# Patient Record
Sex: Male | Born: 1980 | Race: White | Hispanic: No | Marital: Single | Smoking: Current every day smoker
Health system: Southern US, Community
[De-identification: ages and names within clinical notes are randomized; demographics above are authoritative.]

## PROBLEM LIST (undated history)

## (undated) HISTORY — PX: ANKLE SURGERY: SHX546

---

## 2009-11-10 ENCOUNTER — Ambulatory Visit: Payer: Self-pay | Admitting: Diagnostic Radiology

## 2009-11-10 ENCOUNTER — Emergency Department (HOSPITAL_BASED_OUTPATIENT_CLINIC_OR_DEPARTMENT_OTHER): Admission: EM | Admit: 2009-11-10 | Discharge: 2009-11-10 | Payer: Self-pay | Admitting: Emergency Medicine

## 2010-04-22 ENCOUNTER — Emergency Department (HOSPITAL_BASED_OUTPATIENT_CLINIC_OR_DEPARTMENT_OTHER): Admission: EM | Admit: 2010-04-22 | Discharge: 2010-04-22 | Payer: Self-pay | Admitting: Emergency Medicine

## 2010-07-20 ENCOUNTER — Emergency Department (INDEPENDENT_AMBULATORY_CARE_PROVIDER_SITE_OTHER)
Admission: EM | Admit: 2010-07-20 | Discharge: 2010-07-20 | Payer: Self-pay | Source: Home / Self Care | Admitting: Emergency Medicine

## 2010-07-20 DIAGNOSIS — S81009A Unspecified open wound, unspecified knee, initial encounter: Secondary | ICD-10-CM

## 2010-07-20 DIAGNOSIS — Z1810 Retained metal fragments, unspecified: Secondary | ICD-10-CM

## 2010-07-20 DIAGNOSIS — Y9269 Other specified industrial and construction area as the place of occurrence of the external cause: Secondary | ICD-10-CM

## 2010-07-20 DIAGNOSIS — W269XXA Contact with unspecified sharp object(s), initial encounter: Secondary | ICD-10-CM

## 2011-11-10 ENCOUNTER — Emergency Department (HOSPITAL_BASED_OUTPATIENT_CLINIC_OR_DEPARTMENT_OTHER): Payer: Self-pay

## 2011-11-10 ENCOUNTER — Encounter (HOSPITAL_BASED_OUTPATIENT_CLINIC_OR_DEPARTMENT_OTHER): Payer: Self-pay | Admitting: *Deleted

## 2011-11-10 ENCOUNTER — Emergency Department (HOSPITAL_BASED_OUTPATIENT_CLINIC_OR_DEPARTMENT_OTHER)
Admission: EM | Admit: 2011-11-10 | Discharge: 2011-11-10 | Disposition: A | Discharge status: Home / Self Care | Payer: Self-pay | Attending: Emergency Medicine | Admitting: Emergency Medicine

## 2011-11-10 DIAGNOSIS — Y990 Civilian activity done for income or pay: Secondary | ICD-10-CM | POA: Insufficient documentation

## 2011-11-10 DIAGNOSIS — W319XXA Contact with unspecified machinery, initial encounter: Secondary | ICD-10-CM | POA: Insufficient documentation

## 2011-11-10 DIAGNOSIS — S6010XA Contusion of unspecified finger with damage to nail, initial encounter: Secondary | ICD-10-CM

## 2011-11-10 DIAGNOSIS — S6000XA Contusion of unspecified finger without damage to nail, initial encounter: Secondary | ICD-10-CM | POA: Insufficient documentation

## 2011-11-10 DIAGNOSIS — F17200 Nicotine dependence, unspecified, uncomplicated: Secondary | ICD-10-CM | POA: Insufficient documentation

## 2011-11-10 NOTE — ED Provider Notes (Signed)
History     CSN: [id number]  Arrival date & time 11/10/11  1430   First MD Initiated Contact with Patient 11/10/11 1514      Chief Complaint  Patient presents with  . Finger Injury    (Consider location/radiation/quality/duration/timing/severity/associated sxs/prior treatment) Patient is a 31 y.o. male presenting with hand pain. The history is provided by the patient. No language interpreter was used.  Hand Pain This is a new problem. The current episode started yesterday. The problem occurs constantly. The problem has been gradually worsening. The symptoms are aggravated by nothing. He has tried nothing for the symptoms. The treatment provided moderate relief.  Pt reports finger was crushed in a machine.  Pt complains of blood under nail  History reviewed. No pertinent past medical history.  Past Surgical History  Procedure Date  . Ankle surgery     History reviewed. No pertinent family history.  History  Substance Use Topics  . Smoking status: Current Everyday Smoker  . Smokeless tobacco: Not on file  . Alcohol Use: Yes      Review of Systems  Skin: Positive for wound.  All other systems reviewed and are negative.    Allergies  Darvocet  Home Medications  No current outpatient prescriptions on file.  BP 143/80  Pulse 91  Temp(Src) 98.3 F (36.8 C) (Oral)  Resp 18  Ht 6\' 2"  (1.88 m)  Wt 260 lb (117.935 kg)  BMI 33.38 kg/m2  SpO2 96%  Physical Exam  Nursing note and vitals reviewed. Constitutional: He appears well-developed and well-nourished.  HENT:  Head: Normocephalic.  Musculoskeletal: He exhibits tenderness.       Subungual hematoma left index finger,   From,  nv and ns intact  Neurological: He is alert.  Skin: Skin is warm.  Psychiatric: He has a normal mood and affect.    ED Course  Procedures (including critical care time)  Labs Reviewed - No data to display No results found.   No diagnosis found.    MDM  No results found for  this or any previous visit. Dg Finger Index Left  11/10/2011  *RADIOLOGY REPORT*  Clinical Data: Injury to left index finger having pain in the distal aspect of the finger.  LEFT INDEX FINGER 2+V  Comparison: No priors  Findings: Three views of the left second finger demonstrate no acute fracture, subluxation, dislocation, joint or soft tissue abnormality.  IMPRESSION: No acute radiographic abnormality of the left second finger.  Original Report Authenticated By: Florencia Reasons, M.D.   Procedure.   Alcohol prep,  Eye cautery used to punture nail, trepanation  (Pt reports pain and pressure relief)        Elson Areas, [place] 11/10/11 1639

## 2011-11-10 NOTE — ED Notes (Signed)
Pt states he was helping friend put up fence and a 2000 lb machine came down on his left index finger. C/O pain and swelling to same. Bruising noted. Cannot feel touch at tip. Cap refill<3 sec

## 2011-11-10 NOTE — ED Provider Notes (Signed)
Medical screening examination/treatment/procedure(s) were performed by non-physician practitioner and as supervising physician I was immediately available for consultation/collaboration.   Kharisma Glasner, MD 11/10/11 2032 

## 2011-11-10 NOTE — Discharge Instructions (Signed)
Subungual Hematoma  A subungual hematoma is a collection of blood under the fingernail. It is caused by an injury to fingers or toes that breaks the blood vessels beneath the nail. The caregiver may have made a hole in the nail to drain the blood. Draining the blood from beneath the nail is painless and usually gives dramatic relief from the pain. Your nail will usually grow back normally.  HOME CARE INSTRUCTIONS    Apply ice to the injured area for 15 to 20 minutes, 3 to 4 times per day for the first 1 or 2 days.   Put the ice in a plastic bag and place a towel between the bag of ice and your skin. Discontinue use if it causes pain.   Elevate your hand or your foot whenever possible to decrease pain and swelling.   You may remove the bandage in the number of days directed by your caregiver.   Your nail may fall off. If this occurs, trim it gently to keep it from catching on something and causing further injury.   If you have been given a tetanus shot, your arm may get swollen, red, and warm to touch at the shot site. This is a normal response to the medicine in the shot. If you did not receive a tetanus shot today because you did not recall when your last one was given, check with your caregiver's office and determine if one is needed. Generally for a "dirty" wound, you should receive a tetanus booster if you have not had one in the last five years. If you have a "clean" wound, you should receive a tetanus booster if you have not had one within the last ten years.  SEEK IMMEDIATE MEDICAL CARE IF:    You develop redness (inflammation) or swelling around the affected nail.   You develop a pus like (purulent) discharge from around the affected nail.   You have pain not controlled with over-the-counter medicines. Only take over-the-counter or prescription medicines for pain, discomfort, or fever as directed by your caregiver.   You have a fever.  Document Released: 06/07/2000 Document Revised: 05/30/2011  Document Reviewed: 05/29/2011  [employer] Patient Information 2012 [employer].

## 2012-01-03 ENCOUNTER — Emergency Department (HOSPITAL_BASED_OUTPATIENT_CLINIC_OR_DEPARTMENT_OTHER)
Admission: EM | Admit: 2012-01-03 | Discharge: 2012-01-03 | Disposition: A | Discharge status: Home / Self Care | Payer: Self-pay | Attending: Emergency Medicine | Admitting: Emergency Medicine

## 2012-01-03 ENCOUNTER — Encounter (HOSPITAL_BASED_OUTPATIENT_CLINIC_OR_DEPARTMENT_OTHER): Payer: Self-pay | Admitting: Family Medicine

## 2012-01-03 DIAGNOSIS — K029 Dental caries, unspecified: Secondary | ICD-10-CM | POA: Insufficient documentation

## 2012-01-03 DIAGNOSIS — K0889 Other specified disorders of teeth and supporting structures: Secondary | ICD-10-CM

## 2012-01-03 MED ORDER — IBUPROFEN 800 MG PO TABS: 800.0000 mg | ORAL_TABLET | Freq: Three times a day (TID) | ORAL | Status: AC

## 2012-01-03 MED ORDER — PENICILLIN V POTASSIUM 500 MG PO TABS: 500.0000 mg | ORAL_TABLET | Freq: Three times a day (TID) | ORAL | Status: AC

## 2012-01-03 MED ORDER — HYDROCODONE-ACETAMINOPHEN 5-325 MG PO TABS: 2.0000 | ORAL_TABLET | ORAL | Status: AC | PRN

## 2012-01-03 NOTE — ED Provider Notes (Signed)
History     CSN: [id number]  Arrival date & time 01/03/12  8295   First MD Initiated Contact with Patient 01/03/12 1914      Chief Complaint  Patient presents with  . Dental Pain    (Consider location/radiation/quality/duration/timing/severity/associated sxs/prior treatment) Patient is a 31 y.o. male presenting with tooth pain. The history is provided by the patient.  Dental PainThe primary symptoms include mouth pain. Primary symptoms do not include fever. The symptoms are worsening.  Additional symptoms do not include: facial swelling. Associated symptoms comments: Pain at site of known cavity x 2 days. No facial swelling or fever. He has dental appointment next week for further care. Marland Kitchen    History reviewed. No pertinent past medical history.  Past Surgical History  Procedure Date  . Ankle surgery     No family history on file.  History  Substance Use Topics  . Smoking status: Current Everyday Smoker  . Smokeless tobacco: Not on file  . Alcohol Use: Yes      Review of Systems  Constitutional: Negative for fever.  HENT: Positive for dental problem. Negative for facial swelling.     Allergies  Darvocet  Home Medications   Current Outpatient Rx  Name Route Sig Dispense Refill  . TIGER BALM PAIN RELIEVING EX Apply externally Apply 1 application topically daily as needed. Patient used this medication for pain.    . IBUPROFEN 200 MG PO TABS Oral Take 400 mg by mouth every 6 (six) hours as needed. Patient used this medication for his ankle pain.    Marland Kitchen ONE-DAILY MULTI VITAMINS PO TABS Oral Take 1 tablet by mouth daily.      BP 130/67  Pulse 81  Temp 98 F (36.7 C) (Oral)  Resp 20  Ht 6\' 3"  (1.905 m)  Wt 270 lb (122.471 kg)  BMI 33.75 kg/m2  SpO2 97%  Physical Exam  Constitutional: He appears well-developed and well-nourished.  HENT:       Generally good dentition. Large eroded cavity in right lower 2nd molar. Surround gum tenderness without pointing  abscess.     ED Course  Procedures (including critical care time)  Labs Reviewed - No data to display No results found.   No diagnosis found. 1. Dental cavity    MDM  No further evaluation required. Will treat with abx, pain medication.        Rodena Medin, PA-C 01/03/12 2002

## 2012-01-03 NOTE — ED Notes (Signed)
Pt sts he has a chip on right lower back tooth x 1 year. Pt sts today it started hurting. Pt sts dentist couldn't see him today.

## 2012-01-04 NOTE — ED Provider Notes (Signed)
Medical screening examination/treatment/procedure(s) were performed by non-physician practitioner and as supervising physician I was immediately available for consultation/collaboration.   Leigh-Ann Jasa Dundon, MD 01/04/12 0129 

## 2012-01-20 IMAGING — CR DG TIBIA/FIBULA 2V*R*
4 series · 4 of 4 positions shown · non-contrast
Comparison: None.

CLINICAL DATA: Penetrating trauma.

RIGHT TIBIA AND FIBULA - 2 VIEW

[t tib/fib ap right (1 of 2)]
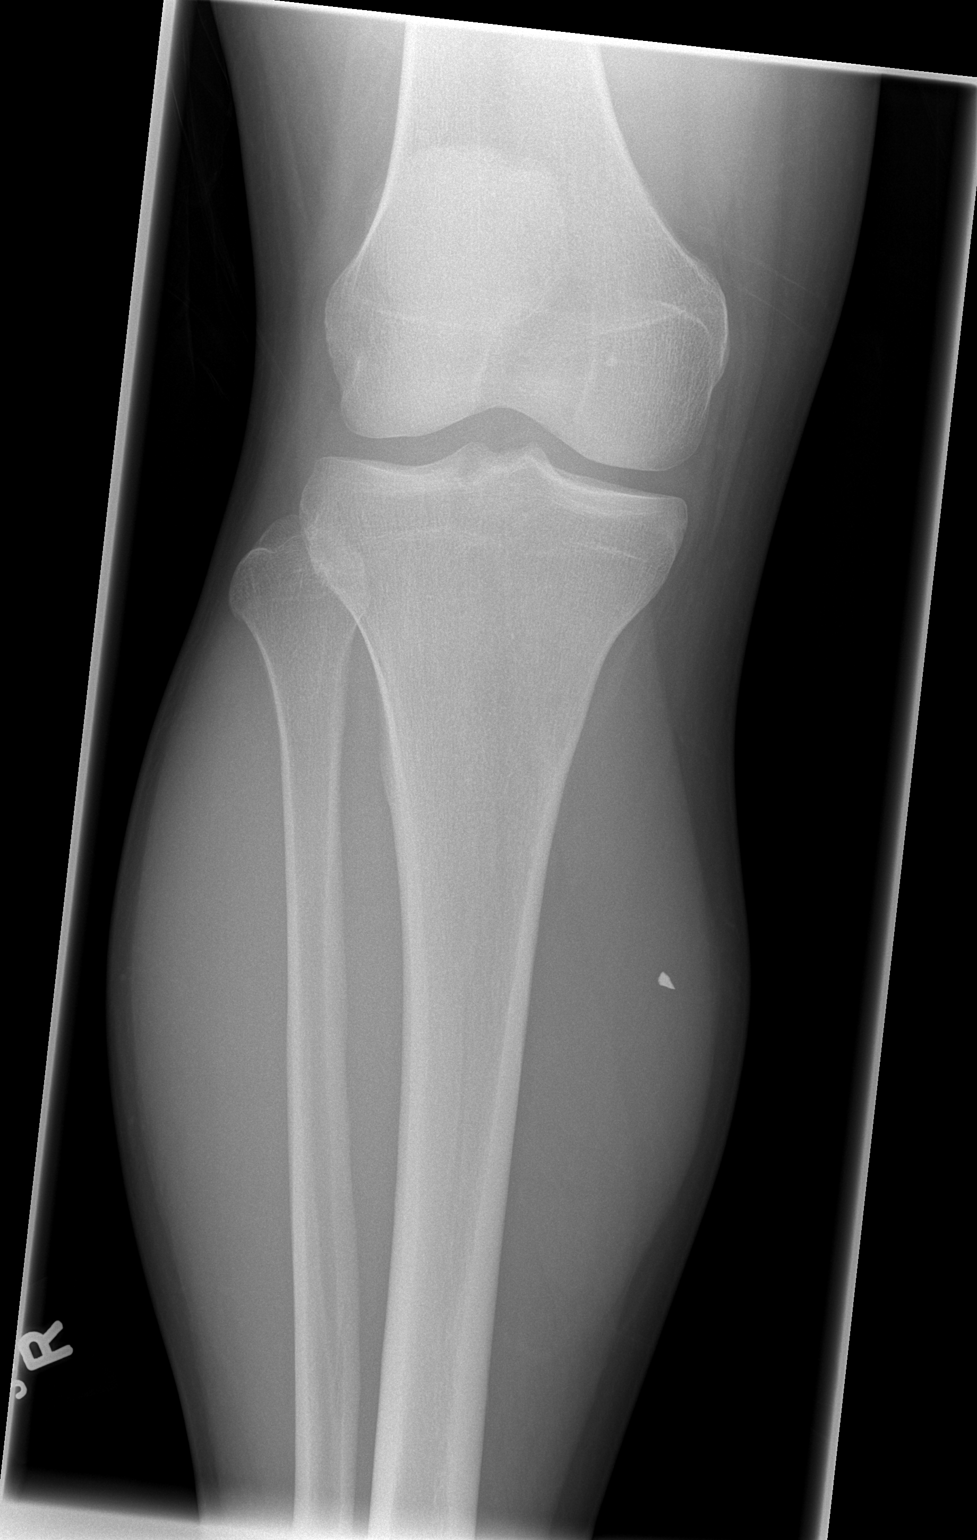

[t tib/fib ap right (2 of 2)]
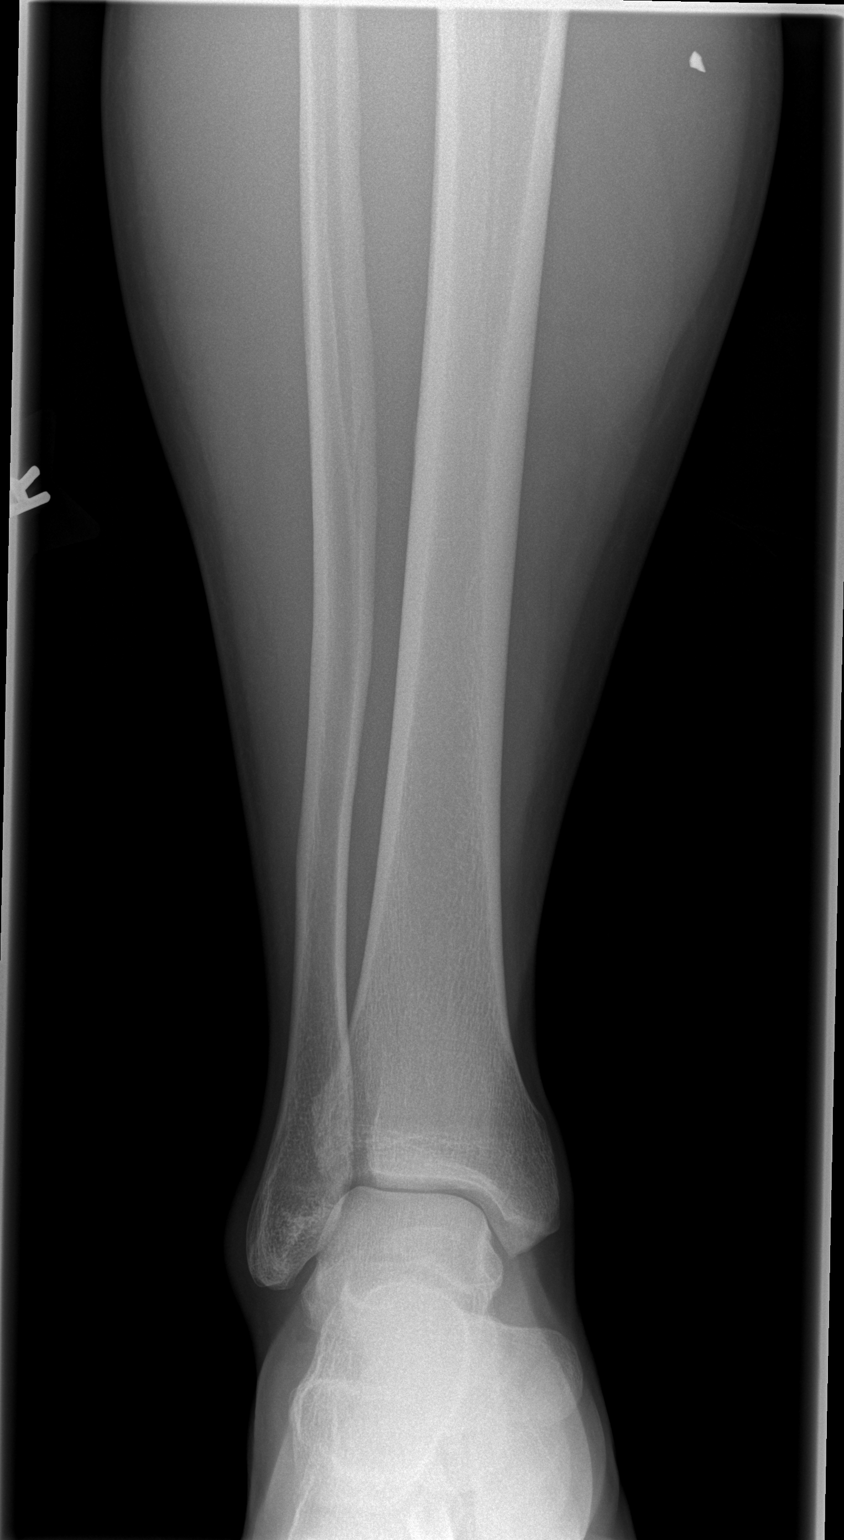

[t tib/fib lat right (1 of 2)]
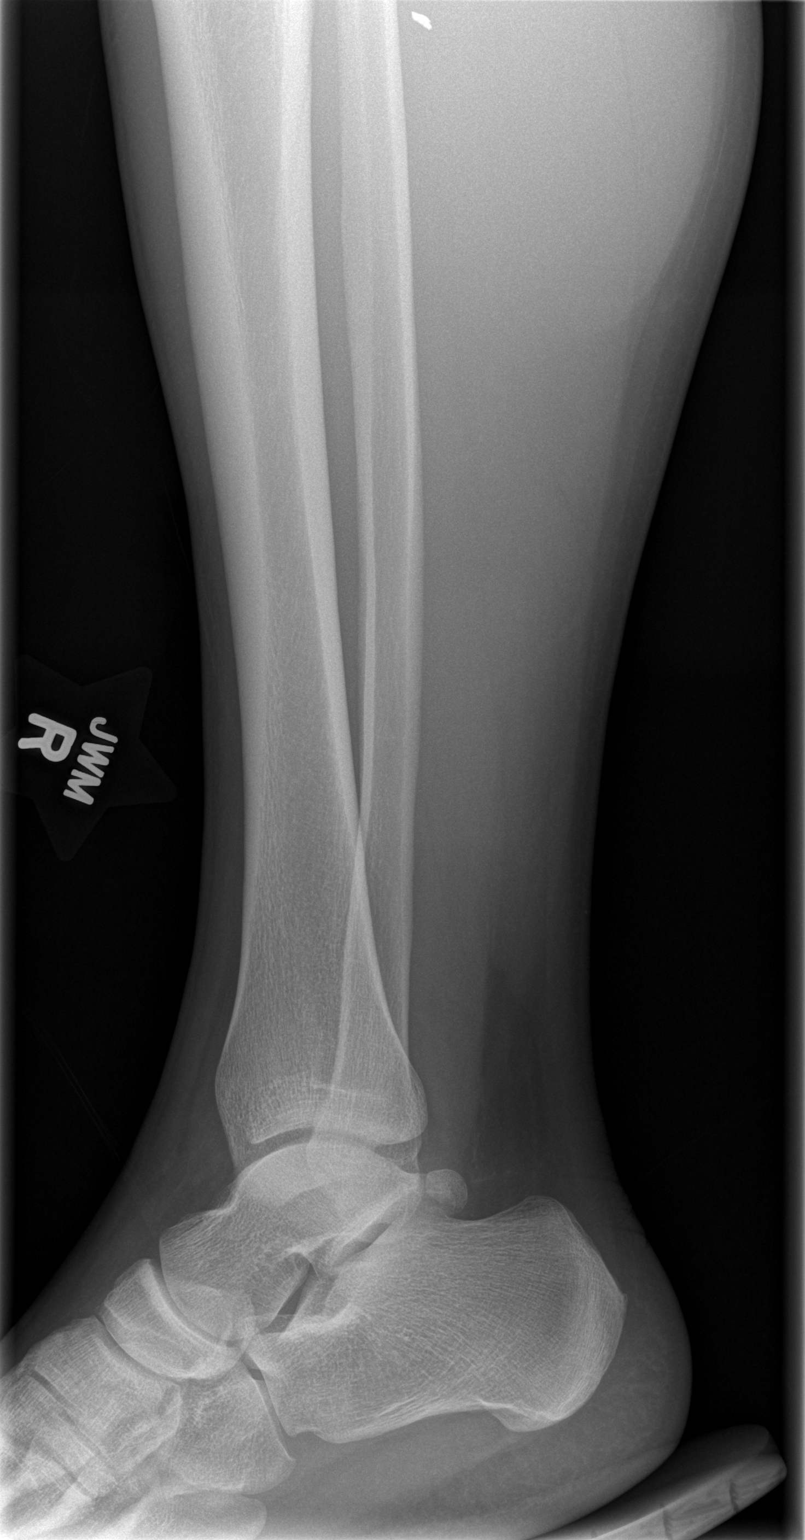

[t tib/fib lat right (2 of 2)]
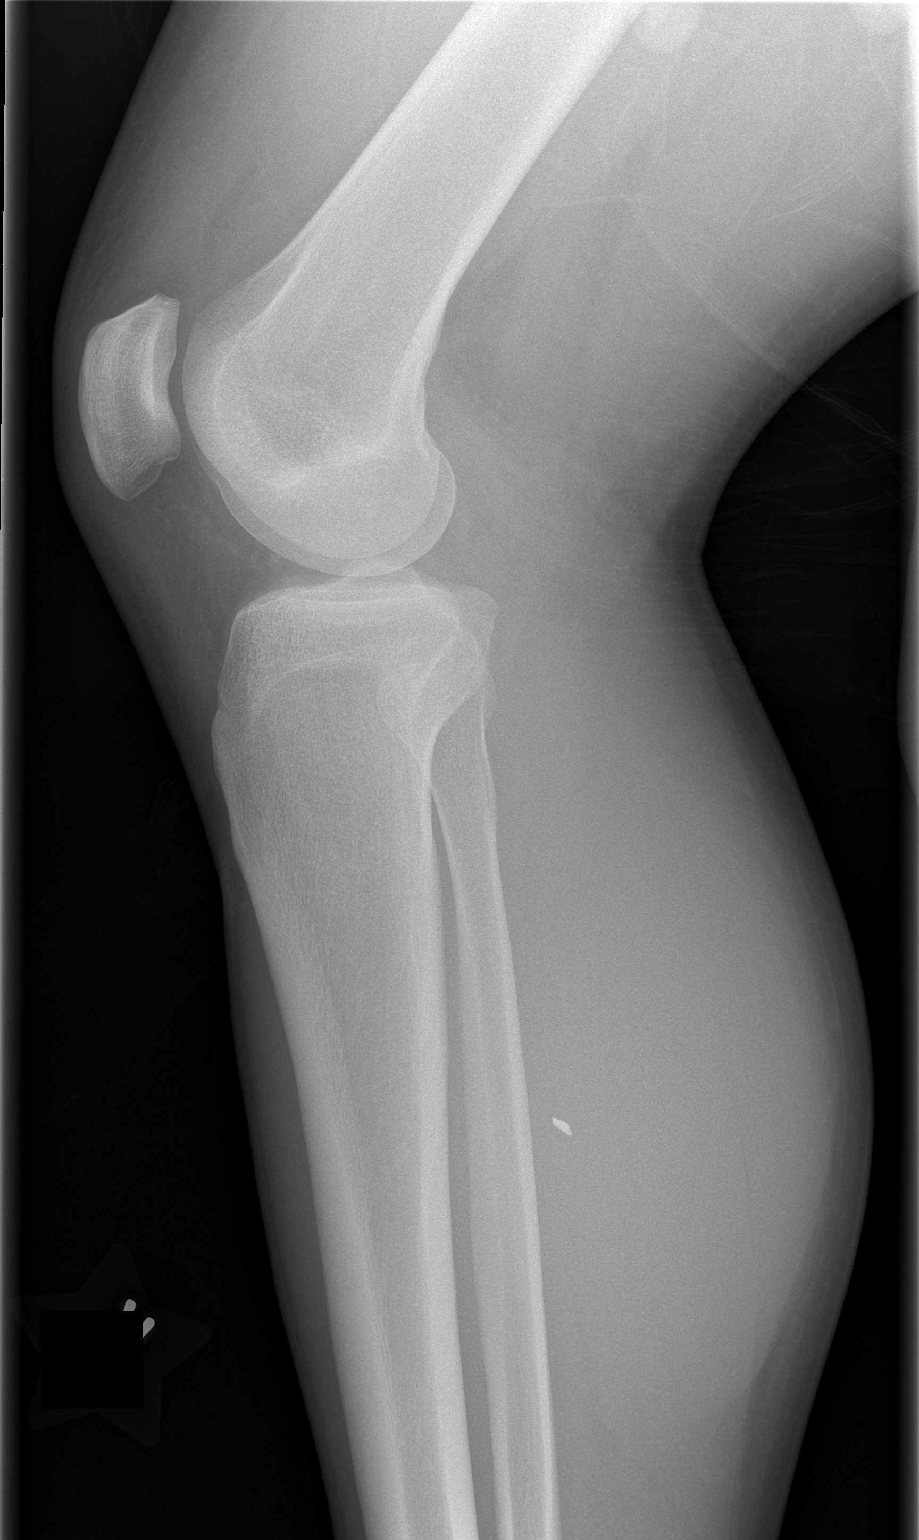

[4 of 4 positions shown; findings below may reference images not displayed]

FINDINGS: A metallic radiopaque foreign body measuring a maximum
0.5 cm in diameter is seen in the medial soft tissues of the right
lower leg.  The foreign body appears to lie within muscle.  No soft
tissue gas collection is identified.  No fracture.
IMPRESSION: Metallic foreign body in the musculature of the medial right lower
leg as above.

## 2012-09-19 ENCOUNTER — Emergency Department (HOSPITAL_BASED_OUTPATIENT_CLINIC_OR_DEPARTMENT_OTHER)
Admission: EM | Admit: 2012-09-19 | Discharge: 2012-09-19 | Disposition: A | Discharge status: Home / Self Care | Payer: Self-pay | Attending: Emergency Medicine | Admitting: Emergency Medicine

## 2012-09-19 ENCOUNTER — Emergency Department (HOSPITAL_BASED_OUTPATIENT_CLINIC_OR_DEPARTMENT_OTHER): Payer: Self-pay

## 2012-09-19 ENCOUNTER — Encounter (HOSPITAL_BASED_OUTPATIENT_CLINIC_OR_DEPARTMENT_OTHER): Payer: Self-pay | Admitting: *Deleted

## 2012-09-19 DIAGNOSIS — M549 Dorsalgia, unspecified: Secondary | ICD-10-CM | POA: Insufficient documentation

## 2012-09-19 DIAGNOSIS — M542 Cervicalgia: Secondary | ICD-10-CM | POA: Insufficient documentation

## 2012-09-19 DIAGNOSIS — F17200 Nicotine dependence, unspecified, uncomplicated: Secondary | ICD-10-CM | POA: Insufficient documentation

## 2012-09-19 DIAGNOSIS — R209 Unspecified disturbances of skin sensation: Secondary | ICD-10-CM | POA: Insufficient documentation

## 2012-09-19 DIAGNOSIS — Z79899 Other long term (current) drug therapy: Secondary | ICD-10-CM | POA: Insufficient documentation

## 2012-09-19 DIAGNOSIS — M25519 Pain in unspecified shoulder: Secondary | ICD-10-CM | POA: Insufficient documentation

## 2012-09-19 DIAGNOSIS — M25512 Pain in left shoulder: Secondary | ICD-10-CM

## 2012-09-19 MED ORDER — HYDROCODONE-ACETAMINOPHEN 5-325 MG PO TABS: 1.0000 | ORAL_TABLET | ORAL | Status: DC | PRN

## 2012-09-19 NOTE — ED Notes (Signed)
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 PMS intact. Some intermittent periods of tingling at times.

## 2012-09-19 NOTE — ED Provider Notes (Signed)
History     CSN: [id number]  Arrival date & time 09/19/12  1616   First MD Initiated Contact with Patient 09/19/12 1738      Chief Complaint  Patient presents with  . Shoulder Pain    (Consider location/radiation/quality/duration/timing/severity/associated sxs/prior treatment) HPI Comments: Patient is a 32 y/o M c/o left shoulder pain x 8 days. Patient reported that last Saturday, while working, patient was cutting trees and when he threw a log he hurt his left shoulder. Patient reported that pain is more of shooting pain that is severe and constant, with radiation to the left arm, left side of the neck, left scapula, left mid-axillary region. Patient reported that shoulder is mainly stiff in the morning and pain is more of a constant, dull ache in the evening. Patient reported that motion and lifting heavy objects increases the pain. Patient reported using icy hot, heating pads, and Ibuprofen with minimal relief. Associated symptoms are intermittent numbness to left arm and digits depending on position, mild swelling that has decreased. Patient denied paresthesias, direct trauma to left shoulder, inflammation, erythema, chest pain, shortness of breathe, difficulty breathing, headaches, dizziness, visual distortions.   Patient is a 32 y.o. male presenting with shoulder pain. The history is provided by the patient. No language interpreter was used.  Shoulder Pain This is a new problem. The current episode started in the past 7 days. The problem occurs constantly. The problem has been gradually worsening. Associated symptoms include arthralgias, neck pain and numbness. Pertinent negatives include no abdominal pain, change in bowel habit, chest pain, diaphoresis, fever, headaches, nausea, rash, sore throat, visual change, vomiting or weakness. Exacerbated by: Lifting heavy objects, motion of left shoulder. He has tried NSAIDs (icy hot, icing) for the symptoms. The treatment provided no relief.     History reviewed. No pertinent past medical history.  Past Surgical History  Procedure Laterality Date  . Ankle surgery      History reviewed. No pertinent family history.  History  Substance Use Topics  . Smoking status: Current Every Day Smoker  . Smokeless tobacco: Not on file  . Alcohol Use: Yes      Review of Systems  Constitutional: Negative for fever and diaphoresis.  HENT: Positive for neck pain. Negative for sore throat.   Eyes: Negative for visual disturbance.  Respiratory: Negative for choking, chest tightness and shortness of breath.   Cardiovascular: Negative for chest pain.  Gastrointestinal: Negative for nausea, vomiting, abdominal pain, diarrhea, constipation and change in bowel habit.  Musculoskeletal: Positive for back pain and arthralgias.       Left shoulder pain  Skin: Negative for rash.  Neurological: Positive for numbness. Negative for dizziness, weakness and headaches.  All other systems reviewed and are negative.    Allergies  Darvocet  Home Medications   Current Outpatient Rx  Name  Route  Sig  Dispense  Refill  . Camphor-Menthol-Capsicum (TIGER BALM PAIN RELIEVING EX)   Apply externally   Apply 1 application topically daily as needed. Patient used this medication for pain.         Marland Kitchen HYDROcodone-acetaminophen (NORCO) 5-325 MG per tablet   Oral   Take 1 tablet by mouth every 4 (four) hours as needed for pain.   6 tablet   0   . ibuprofen (ADVIL,MOTRIN) 200 MG tablet   Oral   Take 400 mg by mouth every 6 (six) hours as needed. Patient used this medication for his ankle pain.         Marland Kitchen  Multiple Vitamin (MULTIVITAMIN) tablet   Oral   Take 1 tablet by mouth daily.           BP 137/69  Pulse 88  Temp(Src) 98.8 F (37.1 C) (Oral)  Resp 18  Ht 6\' 3"  (1.905 m)  Wt 270 lb (122.471 kg)  BMI 33.75 kg/m2  SpO2 100%  Physical Exam  Nursing note and vitals reviewed. Constitutional: He is oriented to person, place, and  time. He appears well-developed and well-nourished. No distress.  HENT:  Head: Normocephalic and atraumatic.  Eyes: Conjunctivae and EOM are normal. Pupils are equal, round, and reactive to light. Right eye exhibits no discharge. Left eye exhibits no discharge.  Neck: Normal range of motion. Neck supple. No tracheal deviation present.  Cardiovascular: Normal rate, regular rhythm and intact distal pulses.  Exam reveals no friction rub.   No murmur heard. Pulmonary/Chest: Breath sounds normal. No respiratory distress. He has no wheezes. He has no rales.  Musculoskeletal: Normal range of motion. He exhibits tenderness. He exhibits no edema.  Shoulders: No erythema, inflammation, swelling, deformities b/l. Limited left shoulder full ROM due to pain. Patient able to invert, evert, flex, and extend shoulder b/l. Positive Yergason sign. Negative drop arm test. 5+/5+ strength b/l. Sensation intact b/l. Pulses palpable b/l.   Full ROM to elbow, wrist, hand, and digits b/l.   Lymphadenopathy:    He has no cervical adenopathy.  Neurological: He is alert and oriented to person, place, and time. A cranial nerve deficit is present. He exhibits normal muscle tone. Coordination normal.  Skin: Skin is warm and dry. No rash noted. He is not diaphoretic. No erythema.  Psychiatric: He has a normal mood and affect. His behavior is normal. Thought content normal.    ED Course  Procedures (including critical care time)  Labs Reviewed - No data to display Dg Shoulder Left  09/19/2012  *RADIOLOGY REPORT*  Clinical Data: Left shoulder pain.  LEFT SHOULDER - 2+ VIEW  Comparison: None.  Findings: Three views of the left shoulder are negative for acute fracture or dislocation.  Visualized left ribs are intact.  No evidence for a pneumothorax.  The left scapula is intact.  IMPRESSION: No acute bony abnormalities in the left shoulder.   Original Report Authenticated By: Richarda Overlie, M.D.    Filed Vitals:   09/19/12 1628   BP: 137/69  Pulse: 88  Temp: 98.8 F (37.1 C)  Resp: 18      1. Shoulder pain, left       MDM  DDx: Musculoskeletal injury Left shoulder fracture  Left shoulder x-ray performed - no fracture or bony abnormality found. Suspected musculoskeletal injury to left shoulder. No neurovascular injury suspected. Patient afebrile, normotensive, non-tachycardic, alert and happy affect. Discharged patient with pain medications - discussed with patient on how to take medications - instructed not to drive, use heavy machinery, or drink alcohol or use other drugs while on pain medications. Discussed with patient to continue icing shoulder, rest shoulder, apply icy hot ointment and massage shoulder. Discussed with patient to follow-up with orthopedics on Monday (09/20/2012) - most likely going to need a MRI. Gave patient note for work. Patient agreed to plan of care, understood, all questions answered.          Raymon Mutton, PA-C 09/20/12 [phone]

## 2012-09-20 NOTE — ED Provider Notes (Signed)
Medical screening examination/treatment/procedure(s) were performed by non-physician practitioner and as supervising physician I was immediately available for consultation/collaboration.   Kylee Umana III, MD 09/20/12 1345 

## 2013-05-15 ENCOUNTER — Encounter (HOSPITAL_BASED_OUTPATIENT_CLINIC_OR_DEPARTMENT_OTHER): Payer: Self-pay | Admitting: Emergency Medicine

## 2013-05-15 ENCOUNTER — Emergency Department (HOSPITAL_BASED_OUTPATIENT_CLINIC_OR_DEPARTMENT_OTHER)
Admission: EM | Admit: 2013-05-15 | Discharge: 2013-05-15 | Disposition: A | Discharge status: Home / Self Care | Payer: Self-pay | Attending: Emergency Medicine | Admitting: Emergency Medicine

## 2013-05-15 DIAGNOSIS — F17200 Nicotine dependence, unspecified, uncomplicated: Secondary | ICD-10-CM | POA: Insufficient documentation

## 2013-05-15 DIAGNOSIS — K0381 Cracked tooth: Secondary | ICD-10-CM | POA: Insufficient documentation

## 2013-05-15 DIAGNOSIS — K029 Dental caries, unspecified: Secondary | ICD-10-CM | POA: Insufficient documentation

## 2013-05-15 DIAGNOSIS — R509 Fever, unspecified: Secondary | ICD-10-CM | POA: Insufficient documentation

## 2013-05-15 MED ORDER — PENICILLIN V POTASSIUM 500 MG PO TABS: 500.0000 mg | ORAL_TABLET | Freq: Four times a day (QID) | ORAL | Status: AC

## 2013-05-15 MED ORDER — HYDROCODONE-ACETAMINOPHEN 5-325 MG PO TABS: 2.0000 | ORAL_TABLET | ORAL | Status: DC | PRN

## 2013-05-15 NOTE — ED Notes (Signed)
Dental pain on right upper side.  Pt states the tooth was cracked and feels like it is starting to swell. Low grade fever.  No drainage noted.

## 2013-05-15 NOTE — ED Provider Notes (Signed)
CSN: [id number]     Arrival date & time 05/15/13  1229 History   First MD Initiated Contact with Patient 05/15/13 1244     Chief Complaint  Patient presents with  . Dental Pain   (Consider location/radiation/quality/duration/timing/severity/associated sxs/prior Treatment) HPI Comments: Patient presents with a two-day history of tooth pain. He says he has a cracked tooth it's been chronic for a long time but it started bothering him yesterday. He's noted some swelling to the area. He thought he had a fever last night. He denies any vomiting or facial swelling. He has appointment with his dentist to do some work on his teeth on Wednesday or Thursday of this week. He's usually over-the-counter medicines without relief.  Patient is a 32 y.o. male presenting with tooth pain.  Dental Pain Associated symptoms: fever   Associated symptoms: no facial swelling and no headaches     No past medical history on file. Past Surgical History  Procedure Laterality Date  . Ankle surgery     No family history on file. History  Substance Use Topics  . Smoking status: Current Every Day Smoker  . Smokeless tobacco: Not on file  . Alcohol Use: Yes    Review of Systems  Constitutional: Positive for fever.  HENT: Positive for dental problem. Negative for facial swelling.   Gastrointestinal: Negative for nausea and vomiting.  Neurological: Negative for headaches.    Allergies  Darvocet  Home Medications   Current Outpatient Rx  Name  Route  Sig  Dispense  Refill  . HYDROcodone-acetaminophen (NORCO/VICODIN) 5-325 MG per tablet   Oral   Take 2 tablets by mouth every 4 (four) hours as needed.   15 tablet   0   . ibuprofen (ADVIL,MOTRIN) 200 MG tablet   Oral   Take 400 mg by mouth every 6 (six) hours as needed. Patient used this medication for his ankle pain.         . Multiple Vitamin (MULTIVITAMIN) tablet   Oral   Take 1 tablet by mouth daily.         . penicillin v potassium  (VEETID) 500 MG tablet   Oral   Take 1 tablet (500 mg total) by mouth 4 (four) times daily.   40 tablet   0    BP 119/67  Pulse 75  Temp(Src) 98.3 F (36.8 C) (Oral)  Resp 16  Ht 6\' 2"  (1.88 m)  Wt 245 lb (111.131 kg)  BMI 31.44 kg/m2  SpO2 100% Physical Exam  Constitutional: He is oriented to person, place, and time. He appears well-developed and well-nourished.  HENT:  Patient is decayed teeth. He has tenderness along the right upper first molar. There is no induration or fluctuance. No trismus or facial swelling.  Cardiovascular: Normal rate.   Pulmonary/Chest: Effort normal.  Neurological: He is alert and oriented to person, place, and time.  Skin: Skin is warm and dry.    ED Course  Procedures (including critical care time) Labs Review Labs Reviewed - No data to display Imaging Review No results found.  EKG Interpretation   None       MDM   1. Dental caries    Will start patient on penicillin and Vicodin. Advised him to followup with his dentist this week or return as needed for any worsening symptoms.    Rolan Bucco, MD 05/15/13 1251

## 2013-08-12 ENCOUNTER — Encounter (HOSPITAL_BASED_OUTPATIENT_CLINIC_OR_DEPARTMENT_OTHER): Payer: Self-pay | Admitting: Emergency Medicine

## 2013-08-12 ENCOUNTER — Emergency Department (HOSPITAL_BASED_OUTPATIENT_CLINIC_OR_DEPARTMENT_OTHER)
Admission: EM | Admit: 2013-08-12 | Discharge: 2013-08-12 | Disposition: A | Discharge status: Home / Self Care | Payer: Self-pay | Attending: Emergency Medicine | Admitting: Emergency Medicine

## 2013-08-12 DIAGNOSIS — K029 Dental caries, unspecified: Secondary | ICD-10-CM | POA: Insufficient documentation

## 2013-08-12 DIAGNOSIS — K0889 Other specified disorders of teeth and supporting structures: Secondary | ICD-10-CM

## 2013-08-12 DIAGNOSIS — F17200 Nicotine dependence, unspecified, uncomplicated: Secondary | ICD-10-CM | POA: Insufficient documentation

## 2013-08-12 DIAGNOSIS — Z79899 Other long term (current) drug therapy: Secondary | ICD-10-CM | POA: Insufficient documentation

## 2013-08-12 DIAGNOSIS — Z791 Long term (current) use of non-steroidal anti-inflammatories (NSAID): Secondary | ICD-10-CM | POA: Insufficient documentation

## 2013-08-12 MED ORDER — NAPROXEN 500 MG PO TABS: 500.0000 mg | ORAL_TABLET | Freq: Two times a day (BID) | ORAL | Status: DC

## 2013-08-12 MED ORDER — PENICILLIN V POTASSIUM 500 MG PO TABS: 500.0000 mg | ORAL_TABLET | Freq: Four times a day (QID) | ORAL | Status: AC

## 2013-08-12 NOTE — ED Notes (Signed)
C/o cracked upper right tooth 1 weeks ago-now feels is abscessed with swelling

## 2013-08-12 NOTE — ED Notes (Signed)
Pt states that he broke his tooth Thanksgiving and has been having problems with the right upper tooth since. Pt states that he is hoping for antibiotics since the swelling has increased into his right check.

## 2013-08-12 NOTE — Discharge Instructions (Signed)
Dental Pain °A tooth ache may be caused by cavities (tooth decay). Cavities expose the nerve of the tooth to air and hot or cold temperatures. It may come from an infection or abscess (also called a boil or furuncle) around your tooth. It is also often caused by dental caries (tooth decay). This causes the pain you are having. °DIAGNOSIS  °Your caregiver can diagnose this problem by exam. °TREATMENT  °· If caused by an infection, it may be treated with medications which kill germs (antibiotics) and pain medications as prescribed by your caregiver. Take medications as directed. °· Only take over-the-counter or prescription medicines for pain, discomfort, or fever as directed by your caregiver. °· Whether the tooth ache today is caused by infection or dental disease, you should see your dentist as soon as possible for further care. °SEEK MEDICAL CARE IF: °The exam and treatment you received today has been provided on an emergency basis only. This is not a substitute for complete medical or dental care. If your problem worsens or new problems (symptoms) appear, and you are unable to meet with your dentist, call or return to this location. °SEEK IMMEDIATE MEDICAL CARE IF:  °· You have a fever. °· You develop redness and swelling of your face, jaw, or neck. °· You are unable to open your mouth. °· You have severe pain uncontrolled by pain medicine. °MAKE SURE YOU:  °· Understand these instructions. °· Will watch your condition. °· Will get help right away if you are not doing well or get worse. °Document Released: 06/10/2005 Document Revised: 09/02/2011 Document Reviewed: 01/27/2008 °ExitCare® Patient Information ©2014 [employer]. °  Emergency Department Resource Guide °1) Find a Doctor and Pay Out of Pocket °Although you won't have to find out who is covered by your insurance plan, it is a good idea to ask around and get recommendations. You will then need to call the office and see if the doctor you have chosen will  accept you as a new patient and what types of options they offer for patients who are self-pay. Some doctors offer discounts or will set up payment plans for their patients who do not have insurance, but you will need to ask so you aren't surprised when you get to your appointment. ° °2) Contact Your Local Health Department °Not all health departments have doctors that can see patients for sick visits, but many do, so it is worth a call to see if yours does. If you don't know where your local health department is, you can check in your phone book. The CDC also has a tool to help you locate your state's health department, and many state websites also have listings of all of their local health departments. ° °3) Find a Walk-in Clinic °If your illness is not likely to be very severe or complicated, you may want to try a walk in clinic. These are popping up all over the country in pharmacies, drugstores, and shopping centers. They're usually staffed by nurse practitioners or physician assistants that have been trained to treat common illnesses and complaints. They're usually fairly quick and inexpensive. However, if you have serious medical issues or chronic medical problems, these are probably not your best option. ° °No Primary Care Doctor: °- Call [facility] at  832-8000 - they can help you locate a primary care doctor that  accepts your insurance, provides certain services, etc. °- Physician Referral Service- 1-800-533-3463 ° °Chronic Pain Problems: °Organization           Address  Phone   Notes  °[facility]  (336) 297-2271 Patients need to be referred by their primary care doctor.  ° °Medication Assistance: °Organization         Address  Phone   Notes  °[place] [facility] [address] °[place], [place] [address] (336) 641-8030 --Must be a resident of [place] °-- Must have NO insurance coverage whatsoever (no Medicaid/ Medicare, etc.) °-- The pt.  MUST have a primary care doctor that directs their care regularly and follows them in the community °  °[facility]  (866) 331-1348   °[employer]  (888) 892-1162   ° °Agencies that provide inexpensive medical care: °Organization         Address  Phone   Notes  °[facility]  (336) 832-8035   °[facility]    (336) 832-7272   °[facility] [address] °[place], [place] [address] (336) 832-4777   °[facility] [address], °[place] (336) 271-4999   °[facility]    (336) 373-0678   °[facility]    (336) 272-1050   °[facility] ° [address], [place] Phone:  (336) 832-4444, Fax:  (336) 832-4440 Hours of Operation:  9 am - 6 pm, M-F.  Also accepts Medicaid/Medicare and self-pay.  °[facility] ° [address], [place] Phone: (336) 832-3150, Fax: (336) 832-3151. Hours of Operation:  8:30 am - 5:30 pm, M-F.  Also accepts Medicaid and self-pay.  °[facility] [place] [address], [place] Phone: (336) 878-6027   °[facility] [address], [place], [place] (336)723-1848, Ext. 123 Mondays & Thursdays: 7-9 AM.  First 15 patients are seen on a first come, first serve basis. °  ° °Medicaid-accepting [place] Providers: ° °Organization         Address  Phone   Notes  °[facility] [address], [place] (336) 641-2100 Also accepts self-pay patients.  °[facility] [address], [place] ° (336) 856-9996   °[facility] [address], [place] (336) 288-8857   °[facility] [address] [place] [address], [place] (336) 299-7000   °Veita Bland [address], [place]  ° (336) 373-1557 Only accepts [place] Access Medicaid patients after they have their name applied to their card.  ° °Self-Pay (no insurance) in  [place]: ° °Organization         Address  Phone   Notes  °Sickle Cell Patients, [facility] [address], [place] (336) 832-1970   °[facility] [address], [place] (336) 832-4400   ° [facility] ° [address], [place] (336) 992-4800   °[facility]/Dr. Osei-Bonsu ° [address] [place] [address], [place] or [address], [place] (336) 841-8500 Phone number for both [place] and [place] locations is the same.  °[facility] [address], [place] (336) 299-0000   °[facility] [address] [place] [address],  or [address] (336) 852-7530 °(336) 878-2260   °[facility] [address],  (336) 350-1642, phone; (336) 294-5005, fax Sees patients 1st and 3rd Saturday of every month.  Must not qualify   for public or private insurance (i.e. Medicaid, Medicare, [place] Health Choice, Veterans' Benefits) • Household income should be no more than 200% of the poverty level •The clinic cannot treat you if you are pregnant or think you are pregnant • Sexually transmitted diseases are not treated at the clinic.  ° °Dental Care: °Organization         Address  Phone  Notes  °[facility] [address], [place] (336) 641-6152 Accepts children up to age 21 who are enrolled in Medicaid or [place] Health Choice; pregnant women with a Medicaid card; and children who have applied for Medicaid or [place] Health Choice, but were declined, whose parents can pay a reduced fee at time of service.  °[facility] [place]  [address], [place] (336) 641-7733 Accepts children up to age 21 who are enrolled in Medicaid or [place] Health Choice; pregnant women with a Medicaid card; and children who have applied for Medicaid or [place] Health Choice, but were declined, whose parents  can pay a reduced fee at time of service.  °[facility] ° [address], [place] (336) 641-4533 Patients are seen by appointment only. Walk-ins are not accepted. [facility] will see patients 18 years of age and older. °Monday - Tuesday (8am-5pm) °Most Wednesdays (8:30-5pm) °$30 per visit, cash only  °[facility] ° [address], [place] (336) 641-4533 Patients are seen by appointment only. Walk-ins are not accepted. [facility] will see patients 18 years of age and older. °One Wednesday Evening (Monthly: Volunteer Based).  $30 per visit, cash only  °[facility]  (919) 537-3737 for adults; Children under age 4, call Graduate Pediatric Dentistry at (919) [phone]. Children aged 4-14, please call (919) [phone] to request a pediatric application. ° Dental services are provided in all areas of dental care including fillings, crowns and bridges, complete and partial dentures, implants, gum treatment, root canals, and extractions. Preventive care is also provided. Treatment is provided to both adults and children. °Patients are selected via a lottery and there is often a waiting list. °  °[facility] [address], °[place] ° (336) 763-8833 [email or web address] °  °[facility] [address], [place], [place] (336)723-1848, Ext. 123 Second and Fourth Thursday of each month, opens at 6:30 AM; Clinic ends at 9 AM.  Patients are seen on a first-come first-served basis, and a limited number are seen during each clinic.  ° °[facility] ° [address], [place], [place] (336) 723-7904   Eligibility Requirements °You must have lived in [place], [place], or [place] counties for at least the last three months. °  You cannot be eligible for state or federal sponsored healthcare insurance, including Veterans Administration, Medicaid, or Medicare. °  You generally cannot be eligible for healthcare  insurance through your employer.  °  How to apply: °Eligibility screenings are held every Tuesday and Wednesday afternoon from 1:00 pm until 4:00 pm. You do not need an appointment for the interview!  °[facility] [address], [place], [place] 336-631-2330   °[facility]  336-342-8273   °[facility]  336-703-3100   °[facility]  336-570-6415   ° °Behavioral Health Resources in the Community: °Intensive Outpatient Programs °Organization         Address  Phone    Notes  °[facility] [address], [place], [place] 336-878-6098   °[facility] [address], [place], [place] 336-832-9800   °[facility] [address], [place], [place] ° 336-882-2125   °[facility] [address],  °[place], [place] 1-800-853-5163 or 336-641-4981   °Substance Abuse Resources °Organization         Address  Phone  Notes  °[facility]  336-882-2125   °[facility]  336-784-9470   °[facility]  336-285-9073   °[facility]  336-845-3988   °Residential & Outpatient Substance Abuse Program  1-800-659-3381   °Psychological Services °Organization         Address  Phone  Notes  °[facility]  336- 832-9600   °[facility]  336- 378-7881   °[facility] [address], [place] 1-800-853-5163 or 336-641-4981   ° °Mobile Crisis Teams °Organization         Address  Phone  Notes  °[facility], Mobile Crisis Care Unit  1-877-626-1772   °Assertive °[facility] ° [address] [place], [place] 336-834-9664   °Sharon DeEsch [address] °[place] 336-554-5454   ° °Self-Help/Support Groups °Organization         Address  Phone             Notes  °[facility] - variety of support groups  336- 373-1402 Call for more information  °Narcotics Anonymous (NA),  [facility] [address], °[place]  2 meetings at this location  ° °Residential Treatment Programs °Organization         Address  Phone  Notes  °[facility] [address],    °[place]  1-866-801-8205   °[facility] ° [address], [place], [place] 704-293-8524   °[facility] [address], [place] 336-845-3988 Admissions: 8am-3pm M-F  °[facility] [address]    °[place], [place] 336-841-1104   °[facility] [address], [place], [place] 336-379-7146   °[facility] [address]  °[place], [place] 336-285-9073   °[facility] - Intensive Outpatient [address], , [place] 336-852-3033   °[facility] [address]  °[place], [place] 1-877-615-2722 or 336-784-9470   °[facility] [address] [place], [place] 336-227-7417 Accepts Medicaid  °[facility] [address]  ° [place] 1-800-659-3381 Substance Abuse/Addiction Treatment  ° °[place] Behavioral Health Resources °Organization         Address  Phone  Notes  °[facility]  (888) 581-9988   °Julie Brannon, PhD [address] [place], [place]   (336) 349-5553 or (336) 951-0000   °[facility]   [address] °[place], [place] (336) 349-4454   °[facility] [address], [place], [place] (336) 342-8316 Insurance/Medicaid/sponsorship through [facility]  °[facility] [address]                                    [place], [place] (336) 342-8316 Therapy/tele-psych/case  °[facility] [address]  ° [place], [place] (336) 349-2233    °Dr. Arfeen  (336) 349-4544   °[facility] [place]  [employer] [place]   [facility] 1) [address], [place] °2) [address], [place] °3)  [address], [place] (336) 349-3220 °(336) 342-7768 ° °(336) 342-8140     °[place] [facility] (336) 342-1394 or (336) 342-3537 (After Hours)    ° °   °

## 2013-08-12 NOTE — ED Provider Notes (Signed)
Medical screening examination/treatment/procedure(s) were performed by non-physician practitioner and as supervising physician I was immediately available for consultation/collaboration.     Geoffery Lyonsouglas Barnaby Rippeon, MD 08/12/13 2329

## 2013-08-12 NOTE — ED Provider Notes (Signed)
CSN: [id number]     Arrival date & time 08/12/13  1647 History   First MD Initiated Contact with Patient 08/12/13 1751     Chief Complaint  Patient presents with  . Dental Pain     (Consider location/radiation/quality/duration/timing/severity/associated sxs/prior Treatment) HPI Comments: Patient is a 33 year old male with a history of tobacco use who presents to the emergency department for right upper dentalgia x2 days. Patient states that he cracked his tooth one week ago and the swelling has been worsening over the last 2 days. He has been taking ibuprofen for associated pain with mild relief. Patient states the pain is worse with palpation. He has not followed up with a dentist because he does not have one. Patient denies associated fever, inability to swallow or drooling, shortness of breath, neck pain or stiffness, and oral lesions or bleeding.  Patient is a 33 y.o. male presenting with tooth pain. The history is provided by the patient. No language interpreter was used.  Dental Pain   History reviewed. No pertinent past medical history. Past Surgical History  Procedure Laterality Date  . Ankle surgery     No family history on file. History  Substance Use Topics  . Smoking status: Current Every Day Smoker  . Smokeless tobacco: Not on file  . Alcohol Use: Yes    Review of Systems  HENT: Positive for dental problem.   All other systems reviewed and are negative.      Allergies  Darvocet  Home Medications   Current Outpatient Rx  Name  Route  Sig  Dispense  Refill  . HYDROcodone-acetaminophen (NORCO/VICODIN) 5-325 MG per tablet   Oral   Take 2 tablets by mouth every 4 (four) hours as needed.   15 tablet   0   . ibuprofen (ADVIL,MOTRIN) 200 MG tablet   Oral   Take 400 mg by mouth every 6 (six) hours as needed. Patient used this medication for his ankle pain.         . Multiple Vitamin (MULTIVITAMIN) tablet   Oral   Take 1 tablet by mouth daily.           . naproxen (NAPROSYN) 500 MG tablet   Oral   Take 1 tablet (500 mg total) by mouth 2 (two) times daily with a meal.   30 tablet   0   . penicillin v potassium (VEETID) 500 MG tablet   Oral   Take 1 tablet (500 mg total) by mouth 4 (four) times daily.   40 tablet   0    BP 120/69  Pulse 92  Temp(Src) 97.5 F (36.4 C) (Oral)  Resp 16  Ht 6\' 2"  (1.88 m)  Wt 250 lb (113.399 kg)  BMI 32.08 kg/m2  SpO2 100%  Physical Exam  Nursing note and vitals reviewed. Constitutional: He is oriented to person, place, and time. He appears well-developed and well-nourished. No distress.  HENT:  Head: Normocephalic and atraumatic.  Right Ear: External ear normal. No mastoid tenderness.  Left Ear: No mastoid tenderness.  Nose: Nose normal.  Mouth/Throat: Uvula is midline, oropharynx is clear and moist and mucous membranes are normal. No oral lesions. No trismus in the jaw. Abnormal dentition. Dental caries present. No uvula swelling.    Patient with tenderness to palpation and cracked first right upper premolar. Mild gingival swelling without fluctuance appreciated associated with affected tooth. No oral lesions or bleeding. Uvula midline and patient tolerating secretions without difficulty or drooling.  Eyes: Conjunctivae and EOM  are normal. Pupils are equal, round, and reactive to light. No scleral icterus.  Neck: Normal range of motion. Neck supple.  Cardiovascular: Normal rate, regular rhythm and intact distal pulses.   Pulmonary/Chest: Effort normal. No respiratory distress.  Musculoskeletal: Normal range of motion.  Lymphadenopathy:    He has no cervical adenopathy.  Neurological: He is alert and oriented to person, place, and time.  Skin: Skin is warm and dry. No rash noted. He is not diaphoretic. No erythema. No pallor.  Psychiatric: He has a normal mood and affect. His behavior is normal.    ED Course  Procedures (including critical care time) Labs Review Labs Reviewed - No data  to display  Imaging Review No results found.  EKG Interpretation   None       MDM   Final diagnoses:  Dentalgia   Uncomplicated dentalgia. Patient is well and nontoxic appearing, hemodynamically stable, and afebrile. No area of fluctuance appreciated on exam. Uvula midline and patient tolerating secretions without difficulty. No evidence of peritonsillar abscess. No red flags or signs concerning for Ludwig's angina. Patient stable and appropriate for discharge with dental followup. Will prescribe penicillin to cover for infection and naproxen for pain control. Return precautions provided as well as resource guide. Patient agreeable to plan with no unaddressed concerns.   Filed Vitals:   08/12/13 1655  BP: 120/69  Pulse: 92  Temp: 97.5 F (36.4 C)  TempSrc: Oral  Resp: 16  Height: 6\' 2"  (1.88 m)  Weight: 250 lb (113.399 kg)  SpO2: 100%       Antony Madura, PA-C 08/12/13 1838

## 2013-08-13 ENCOUNTER — Emergency Department (HOSPITAL_BASED_OUTPATIENT_CLINIC_OR_DEPARTMENT_OTHER)
Admission: EM | Admit: 2013-08-13 | Discharge: 2013-08-13 | Disposition: A | Discharge status: Home / Self Care | Payer: Self-pay | Attending: Emergency Medicine | Admitting: Emergency Medicine

## 2013-08-13 ENCOUNTER — Encounter (HOSPITAL_BASED_OUTPATIENT_CLINIC_OR_DEPARTMENT_OTHER): Payer: Self-pay | Admitting: Emergency Medicine

## 2013-08-13 DIAGNOSIS — K029 Dental caries, unspecified: Secondary | ICD-10-CM | POA: Insufficient documentation

## 2013-08-13 DIAGNOSIS — K047 Periapical abscess without sinus: Secondary | ICD-10-CM | POA: Insufficient documentation

## 2013-08-13 DIAGNOSIS — F17200 Nicotine dependence, unspecified, uncomplicated: Secondary | ICD-10-CM | POA: Insufficient documentation

## 2013-08-13 MED ORDER — PENICILLIN G BENZATHINE 1200000 UNIT/2ML IM SUSP
1.2000 10*6.[IU] | Freq: Once | INTRAMUSCULAR | Status: AC
  Administered 2013-08-13: 1.2 10*6.[IU] via INTRAMUSCULAR
  Filled 2013-08-13: qty 2

## 2013-08-13 NOTE — ED Notes (Signed)
D/c home- instructed to finish antibiotics as prescribed

## 2013-08-13 NOTE — ED Provider Notes (Signed)
CSN: [id number]     Arrival date & time 08/13/13  1709 History   First MD Initiated Contact with Patient 08/13/13 1721     Chief Complaint  Patient presents with  . Dental Pain     (Consider location/radiation/quality/duration/timing/severity/associated sxs/prior Treatment) HPI Comments: Patient here with worsening right upper facial swelling and dental pain - he states that he was seen here yesterday - has had 4 doses of medication but has noticed an increase in the swelling - he has not seen a dentist yet.  Denies fever, chills, inability to open his mouth, nausea, or vomiting.  Patient is a 33 y.o. male presenting with tooth pain. The history is provided by the patient. No language interpreter was used.  Dental Pain Location:  Upper Upper teeth location:  3/RU 1st molar Quality:  Aching and dull Severity:  Moderate Onset quality:  Gradual Duration:  3 days Timing:  Constant Progression:  Worsening Chronicity:  Chronic Context: abscess, dental caries and filling fell out   Relieved by:  Nothing Worsened by:  Nothing tried Ineffective treatments:  None tried Associated symptoms: facial pain and facial swelling   Associated symptoms: no drooling, no fever, no gum swelling, no neck pain, no neck swelling, no oral bleeding, no oral lesions and no trismus   Risk factors: smoking     History reviewed. No pertinent past medical history. Past Surgical History  Procedure Laterality Date  . Ankle surgery     History reviewed. No pertinent family history. History  Substance Use Topics  . Smoking status: Current Every Day Smoker  . Smokeless tobacco: Not on file  . Alcohol Use: Yes    Review of Systems  Constitutional: Negative for fever.  HENT: Positive for facial swelling. Negative for drooling and mouth sores.   Musculoskeletal: Negative for neck pain.  All other systems reviewed and are negative.      Allergies  Darvocet  Home Medications   Current Outpatient Rx    Name  Route  Sig  Dispense  Refill  . ibuprofen (ADVIL,MOTRIN) 200 MG tablet   Oral   Take 400 mg by mouth every 6 (six) hours as needed. Patient used this medication for his ankle pain.         . Multiple Vitamin (MULTIVITAMIN) tablet   Oral   Take 1 tablet by mouth daily.         . naproxen (NAPROSYN) 500 MG tablet   Oral   Take 1 tablet (500 mg total) by mouth 2 (two) times daily with a meal.   30 tablet   0   . penicillin v potassium (VEETID) 500 MG tablet   Oral   Take 1 tablet (500 mg total) by mouth 4 (four) times daily.   40 tablet   0   . HYDROcodone-acetaminophen (NORCO/VICODIN) 5-325 MG per tablet   Oral   Take 2 tablets by mouth every 4 (four) hours as needed.   15 tablet   0    BP 115/73  Pulse 83  Temp(Src) 99 F (37.2 C) (Oral)  Resp 18  Ht 6\' 2"  (1.88 m)  Wt 260 lb (117.935 kg)  BMI 33.37 kg/m2  SpO2 100% Physical Exam  Nursing note and vitals reviewed. Constitutional: He is oriented to person, place, and time. He appears well-developed and well-nourished. No distress.  HENT:  Head: Normocephalic and atraumatic.    Right Ear: External ear normal.  Left Ear: External ear normal.  Nose: Nose normal.  Mouth/Throat: No  trismus in the jaw. Dental caries present. No oropharyngeal exudate.    Eyes: No scleral icterus.  Neck: Normal range of motion. Neck supple.  Pulmonary/Chest: Effort normal.  Musculoskeletal: Normal range of motion. He exhibits no edema and no tenderness.  Lymphadenopathy:    He has no cervical adenopathy.  Neurological: He is alert and oriented to person, place, and time. He exhibits normal muscle tone. Coordination normal.  Skin: Skin is warm and dry. No rash noted. No erythema. No pallor.  Psychiatric: He has a normal mood and affect. His behavior is normal. Judgment and thought content normal.    ED Course  Procedures (including critical care time) Labs Review Labs Reviewed - No data to display Imaging Review No  results found.  EKG Interpretation   None      Medications  penicillin g benzathine (BICILLIN LA) 1200000 UNIT/2ML injection 1.2 Million Units (1.2 Million Units Intramuscular Given 08/13/13 1752)     MDM   Final diagnoses:  Dental abscess   Patient returns with worsening dental abscess, no evidence of ludwigs angina, facial or peri-orbital cellulitis.  Given infection here - will be attempting to find dentist for this this coming week.    Izola PriceFrances C. Marisue HumbleSanford, New JerseyPA-C 08/13/13 1856

## 2013-08-13 NOTE — ED Notes (Signed)
Pt states the he was called by nurse and he reported that tooth was swollen twice as bad as previous visit.  RN recommended iv abx to patient.

## 2013-08-13 NOTE — Discharge Instructions (Signed)
Abscessed Tooth An abscessed tooth is an infection around your tooth. It may be caused by holes or damage to the tooth (cavity) or a dental disease. An abscessed tooth causes mild to very bad pain in and around the tooth. See your dentist right away if you have tooth or gum pain. HOME CARE  Take your medicine as told. Finish it even if you start to feel better.  Do not drive after taking pain medicine.  Rinse your mouth (gargle) often with salt water ( teaspoon salt in 8 ounces of warm water).  Do not apply heat to the outside of your face. GET HELP RIGHT AWAY IF:   You have a temperature by mouth above 102 F (38.9 C), not controlled by medicine.  You have chills and a very bad headache.  You have problems breathing or swallowing.  Your mouth will not open.  You develop puffiness (swelling) on the neck or around the eye.  Your pain is not helped by medicine.  Your pain is getting worse instead of better. MAKE SURE YOU:   Understand these instructions.  Will watch your condition.  Will get help right away if you are not doing well or get worse. Document Released: 11/27/2007 Document Revised: 09/02/2011 Document Reviewed: 09/18/2010 [facility] Patient Information 2014 [place].  Dental Abscess A dental abscess is a collection of infected fluid (pus) from a bacterial infection in the inner part of the tooth (pulp). It usually occurs at the end of the tooth's root.  CAUSES   Severe tooth decay.  Trauma to the tooth that allows bacteria to enter into the pulp, such as a broken or chipped tooth. SYMPTOMS   Severe pain in and around the infected tooth.  Swelling and redness around the abscessed tooth or in the mouth or face.  Tenderness.  Pus drainage.  Bad breath.  Bitter taste in the mouth.  Difficulty swallowing.  Difficulty opening the mouth.  Nausea.  Vomiting.  Chills.  Swollen neck glands. DIAGNOSIS   A medical and dental history will be  taken.  An examination will be performed by tapping on the abscessed tooth.  X-rays may be taken of the tooth to identify the abscess. TREATMENT The goal of treatment is to eliminate the infection. You may be prescribed antibiotic medicine to stop the infection from spreading. A root canal may be performed to save the tooth. If the tooth cannot be saved, it may be pulled (extracted) and the abscess may be drained.  HOME CARE INSTRUCTIONS  Only take over-the-counter or prescription medicines for pain, fever, or discomfort as directed by your caregiver.  Rinse your mouth (gargle) often with salt water ( tsp salt in 8 oz [250 ml] of warm water) to relieve pain or swelling.  Do not drive after taking pain medicine (narcotics).  Do not apply heat to the outside of your face.  Return to your dentist for further treatment as directed. SEEK MEDICAL CARE IF:  Your pain is not helped by medicine.  Your pain is getting worse instead of better. SEEK IMMEDIATE MEDICAL CARE IF:  You have a fever or persistent symptoms for more than 2 3 days.  You have a fever and your symptoms suddenly get worse.  You have chills or a very bad headache.  You have problems breathing or swallowing.  You have trouble opening your mouth.  You have swelling in the neck or around the eye. Document Released: 06/10/2005 Document Revised: 03/04/2012 Document Reviewed: 09/18/2010 [employer] Patient Information  2014 [employer]. ° °

## 2013-08-13 NOTE — ED Provider Notes (Signed)
Medical screening examination/treatment/procedure(s) were performed by non-physician practitioner and as supervising physician I was immediately available for consultation/collaboration.  EKG Interpretation   None         Annabeth Tortora, MD 08/13/13 2327 

## 2014-03-22 IMAGING — CR DG SHOULDER 2+V*L*
3 series · 3 of 3 positions shown · non-contrast
Comparison: None.

CLINICAL DATA: Left shoulder pain.

LEFT SHOULDER - 2+ VIEW

[w shoulder ap internal left]
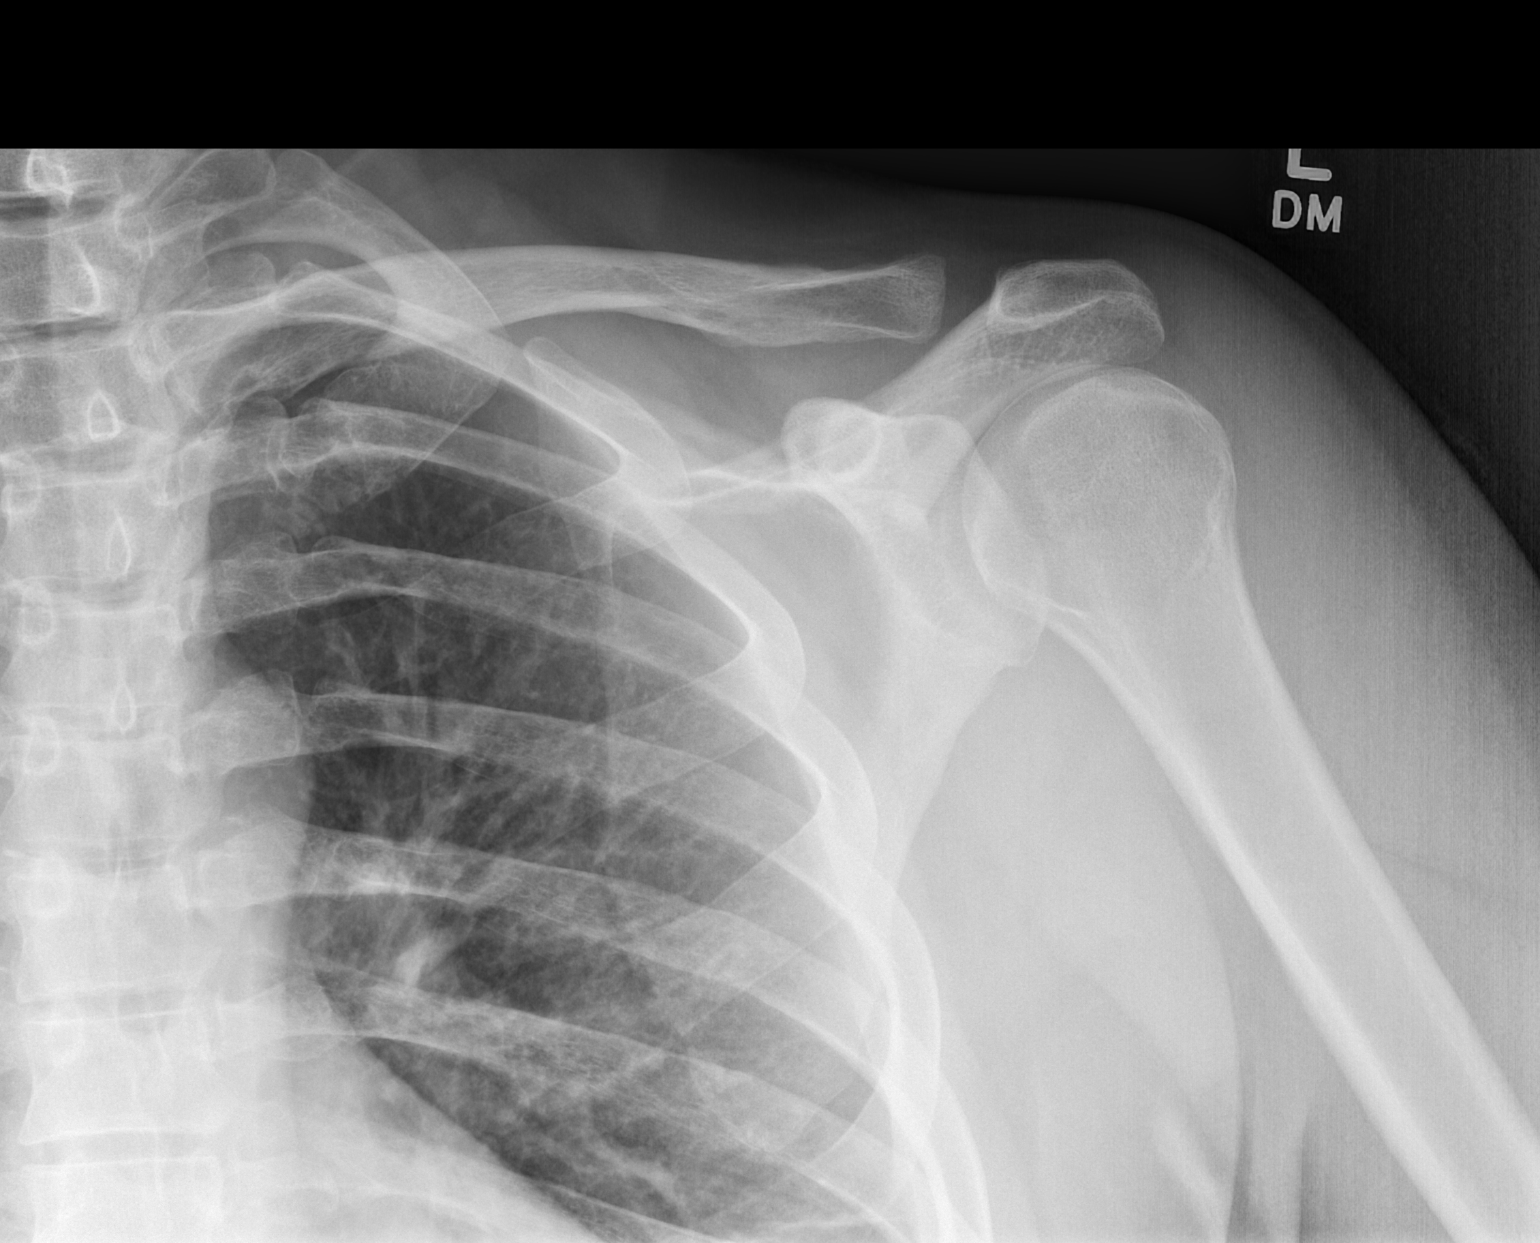

[w shoulder ap external left]
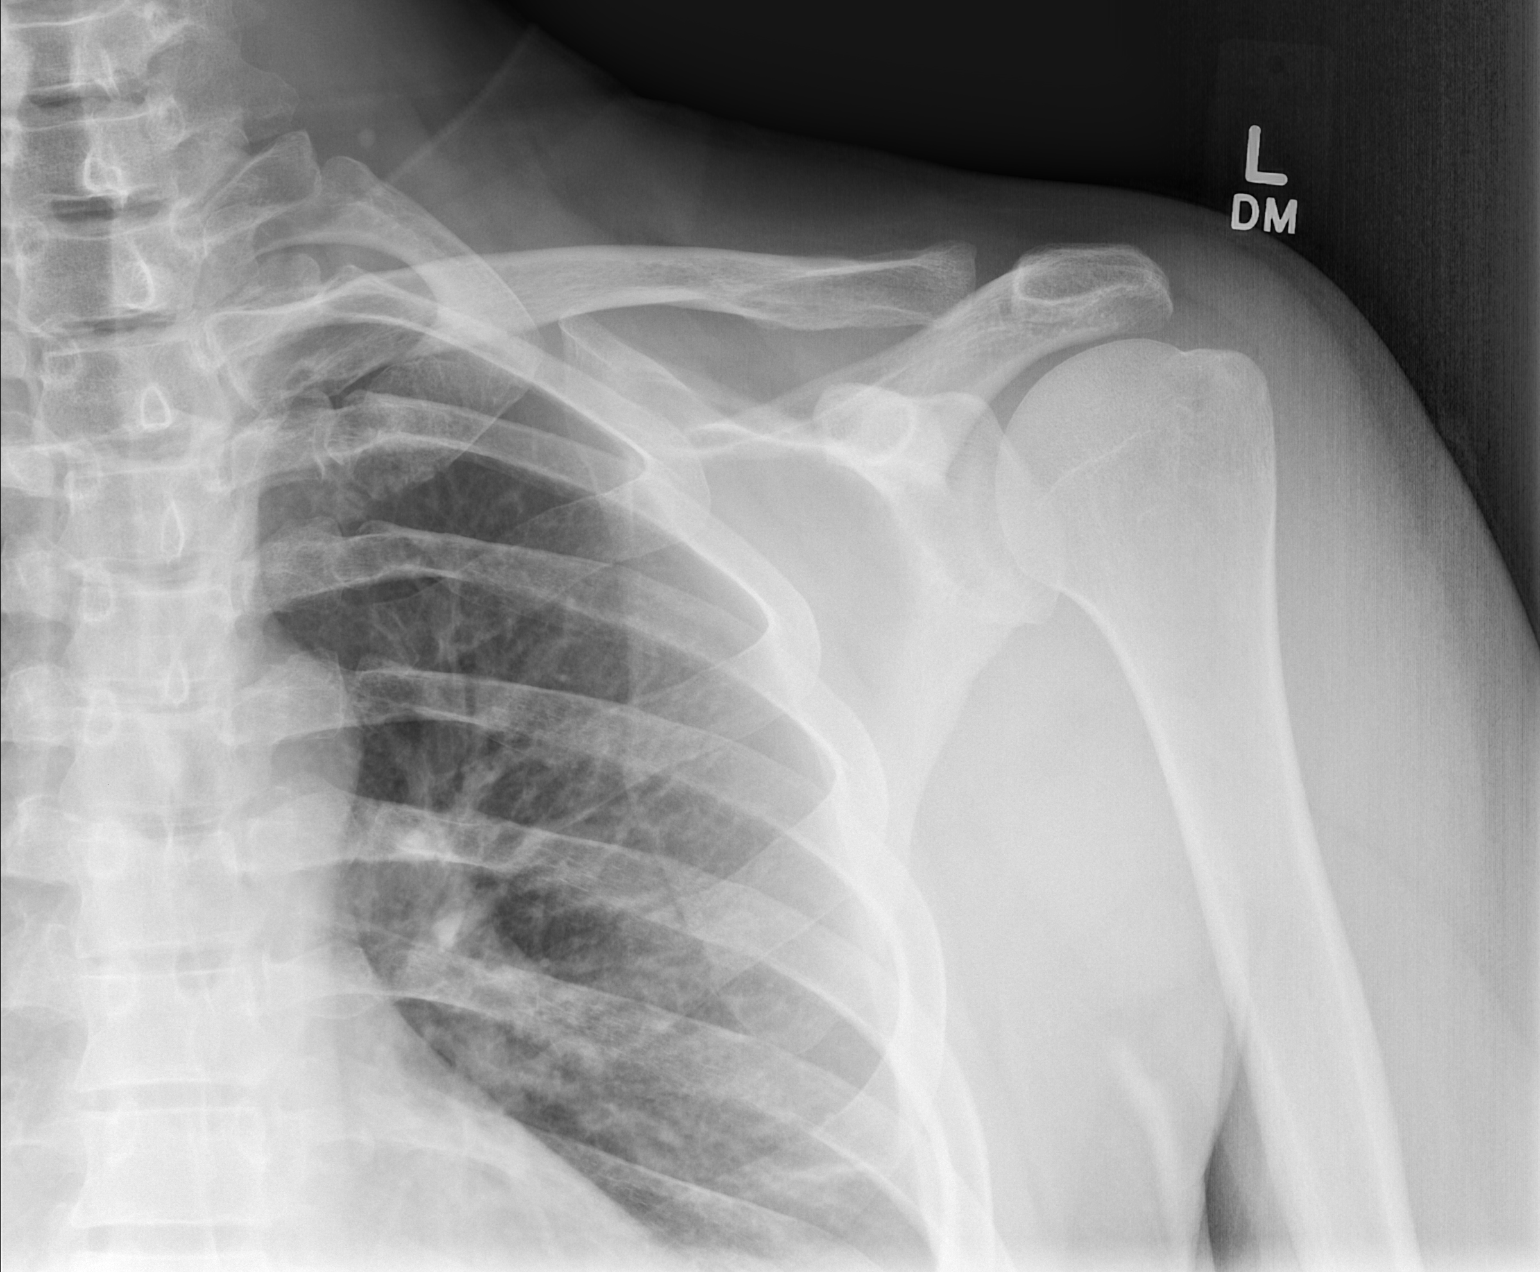

[w shoulder y view left]
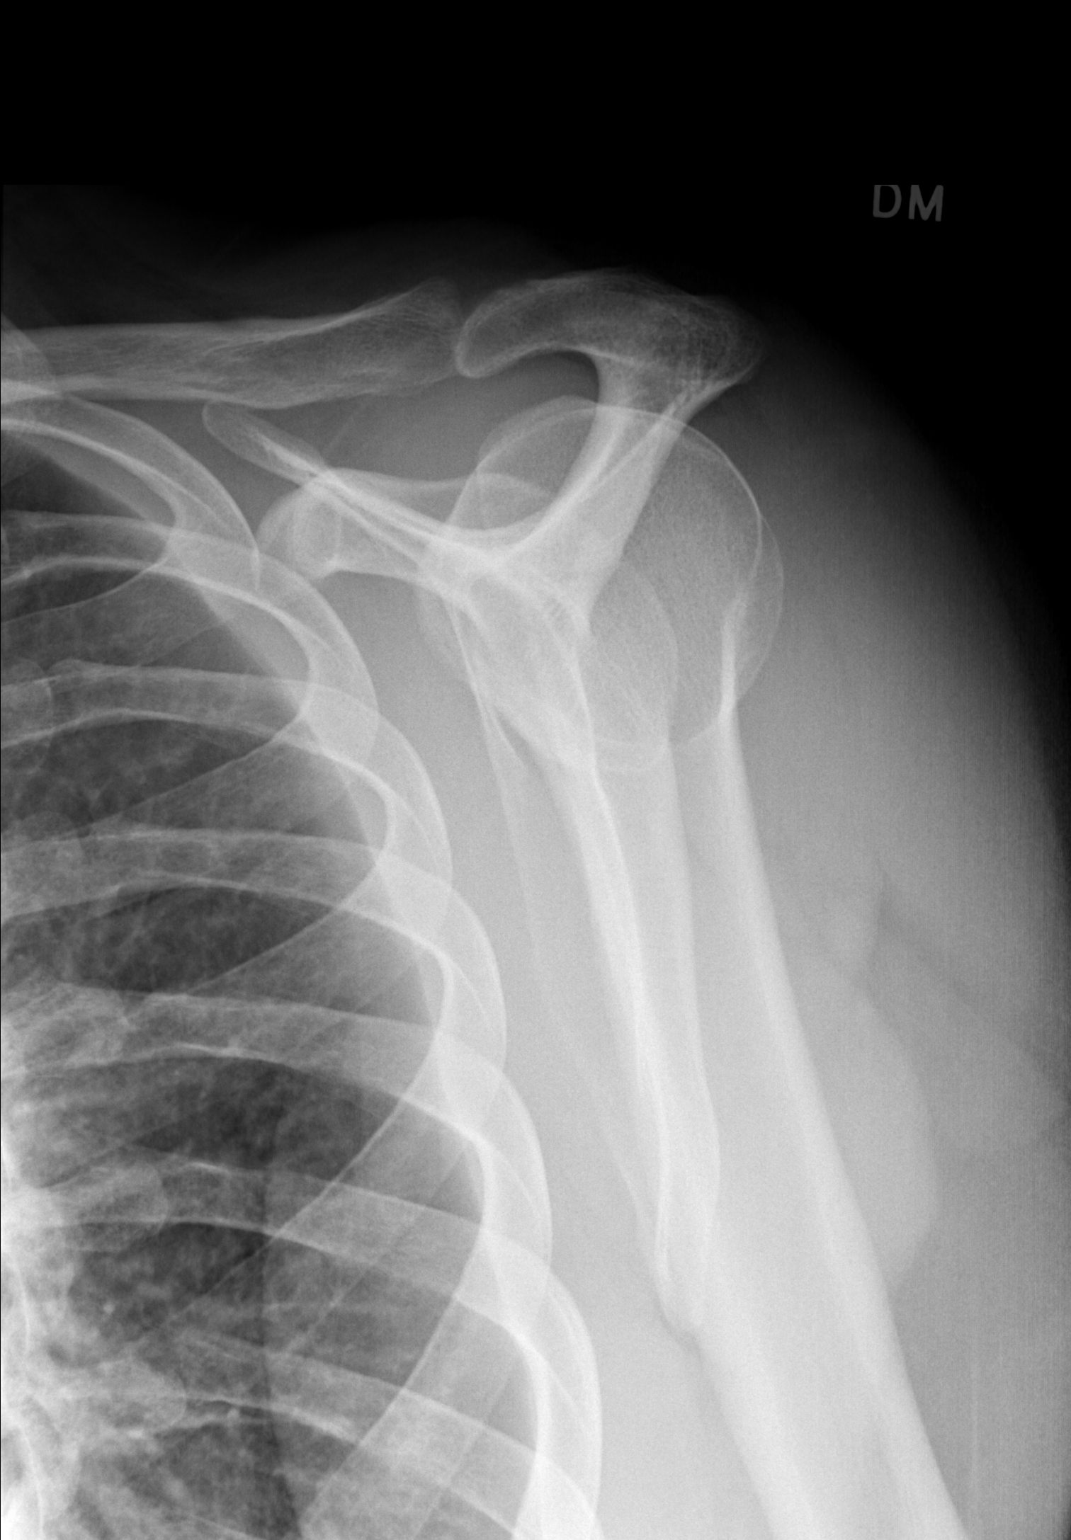

[3 of 3 positions shown; findings below may reference images not displayed]

FINDINGS: Three views of the left shoulder are negative for acute
fracture or dislocation.  Visualized left ribs are intact.  No
evidence for a pneumothorax.  The left scapula is intact.
IMPRESSION: No acute bony abnormalities in the left shoulder.

## 2019-03-03 ENCOUNTER — Encounter (HOSPITAL_BASED_OUTPATIENT_CLINIC_OR_DEPARTMENT_OTHER): Payer: Self-pay | Admitting: *Deleted

## 2019-03-03 ENCOUNTER — Other Ambulatory Visit: Payer: Self-pay

## 2019-03-03 ENCOUNTER — Emergency Department (HOSPITAL_BASED_OUTPATIENT_CLINIC_OR_DEPARTMENT_OTHER)
Admission: EM | Admit: 2019-03-03 | Discharge: 2019-03-03 | Disposition: A | Discharge status: Home / Self Care | Payer: Self-pay | Attending: Emergency Medicine | Admitting: Emergency Medicine

## 2019-03-03 DIAGNOSIS — Z23 Encounter for immunization: Secondary | ICD-10-CM | POA: Insufficient documentation

## 2019-03-03 DIAGNOSIS — Y999 Unspecified external cause status: Secondary | ICD-10-CM | POA: Insufficient documentation

## 2019-03-03 DIAGNOSIS — Y929 Unspecified place or not applicable: Secondary | ICD-10-CM | POA: Insufficient documentation

## 2019-03-03 DIAGNOSIS — Y939 Activity, unspecified: Secondary | ICD-10-CM | POA: Insufficient documentation

## 2019-03-03 DIAGNOSIS — F17210 Nicotine dependence, cigarettes, uncomplicated: Secondary | ICD-10-CM | POA: Insufficient documentation

## 2019-03-03 DIAGNOSIS — S91331A Puncture wound without foreign body, right foot, initial encounter: Secondary | ICD-10-CM | POA: Insufficient documentation

## 2019-03-03 DIAGNOSIS — W450XXA Nail entering through skin, initial encounter: Secondary | ICD-10-CM | POA: Insufficient documentation

## 2019-03-03 MED ORDER — CIPROFLOXACIN HCL 500 MG PO TABS
500.0000 mg | ORAL_TABLET | Freq: Once | ORAL | Status: AC
  Administered 2019-03-03: 20:00:00 500 mg via ORAL
  Filled 2019-03-03: qty 1

## 2019-03-03 MED ORDER — CIPROFLOXACIN HCL 500 MG PO TABS: 500.0000 mg | ORAL_TABLET | Freq: Two times a day (BID) | ORAL | 0 refills | Status: DC

## 2019-03-03 MED ORDER — ONDANSETRON 4 MG PO TBDP: 4.0000 mg | ORAL_TABLET | Freq: Three times a day (TID) | ORAL | 0 refills | Status: DC | PRN

## 2019-03-03 MED ORDER — CEPHALEXIN 250 MG PO CAPS
500.0000 mg | ORAL_CAPSULE | Freq: Once | ORAL | Status: AC
  Administered 2019-03-03: 500 mg via ORAL
  Filled 2019-03-03: qty 2

## 2019-03-03 MED ORDER — CEPHALEXIN 500 MG PO CAPS: 500.0000 mg | ORAL_CAPSULE | Freq: Four times a day (QID) | ORAL | 0 refills | Status: DC

## 2019-03-03 MED ORDER — TETANUS-DIPHTH-ACELL PERTUSSIS 5-2.5-18.5 LF-MCG/0.5 IM SUSP
0.5000 mL | Freq: Once | INTRAMUSCULAR | Status: AC
  Administered 2019-03-03: 0.5 mL via INTRAMUSCULAR

## 2019-03-03 MED ORDER — TETANUS-DIPHTH-ACELL PERTUSSIS 5-2.5-18.5 LF-MCG/0.5 IM SUSP
INTRAMUSCULAR | Status: AC
  Filled 2019-03-03: qty 0.5

## 2019-03-03 NOTE — ED Notes (Signed)
Wound cleansed with normal saline and pat dry.

## 2019-03-03 NOTE — ED Triage Notes (Signed)
Pt c/o stepping on nail with right foot, pt requesting ABX and tdap

## 2019-03-03 NOTE — Discharge Instructions (Signed)
°  Wound Care - General Wound Cleaning: Clean the wound and surrounding area gently with tap water and mild soap. Rinse well and blot dry. Do not scrub the wound, as this may cause the wound edges to come apart. You may shower, but avoid submerging the wound, such as with a bath or swimming.  Clean the wound daily to prevent infection.  Do not use cleaners such as hydrogen peroxide or alcohol.   Please take all of your antibiotics until finished!   You may develop abdominal discomfort or diarrhea from the antibiotic.  You may help offset this with probiotics which you can buy or get in yogurt. Do not eat or take the probiotics until 2 hours after your antibiotic.   There are 2 antibiotics being prescribed to you today.  They each cover a different spectrum of bacteria that could cause infection due to a puncture wound.  Ondansetron (generic for Zofran): This medication has been included to be used as needed for nausea since some antibiotics cause a side effect of nausea.  Scar reduction: Application of a topical antibiotic ointment, such as Neosporin, after the wound has begun to close and heal well can decrease scab formation and reduce scarring. After the wound has healed, application of ointments such as Aquaphor can also reduce scar formation.  The key to scar reduction is keeping the skin well hydrated and supple. Drinking plenty of water throughout the day (At least eight 8oz glasses of water a day) is essential to staying well hydrated.  Pain: You may use Tylenol, naproxen, or ibuprofen for pain. Antiinflammatory medications: Take 600 mg of ibuprofen every 6 hours or 440 mg (over the counter dose) to 500 mg (prescription dose) of naproxen every 12 hours for the next 3 days. After this time, these medications may be used as needed for pain. Take these medications with food to avoid upset stomach. Choose only one of these medications, do not take them together. Acetaminophen (generic for  Tylenol): Should you continue to have additional pain while taking the ibuprofen or naproxen, you may add in acetaminophen as needed. Your daily total maximum amount of acetaminophen from all sources should be limited to 4000mg /day for persons without liver problems, or 2000mg /day for those with liver problems. Follow-up: May return to the ED in 2 days for wound check, as needed.  Return: Return to the ED should signs of infection arise, such as spreading redness, puffiness/swelling, pus draining from the wound, severe increase in pain, fever over 100.66F, or any other major issues.  For prescription assistance, may try using prescription discount sites or apps, such as goodrx.com

## 2019-03-03 NOTE — ED Provider Notes (Signed)
[facility] EMERGENCY DEPARTMENT Provider Note   CSN: [id number] Arrival date & time: 03/03/19  1851     History   Chief Complaint Chief Complaint  Patient presents with  . Foot Injury    HPI James FeilCoy Teaster is a 38 y.o. male.     HPI   James Clements is a 38 y.o. male, patient with no pertinent past medical history, presenting to the ED with a wound to the right foot that occurred yesterday.  Patient states he stepped on a finishing nail while walking outside in bare feet.  He immediately pulled the nail out of his foot and states he is absolutely certain the nail was intact, including the manufacturer's burr at the tip.  He has noted pain from the plantar surface of the foot into the dorsal surface of the foot and states he thinks the foot is swollen. Denies history of MRSA or difficulty with wound healing.  Needs tetanus update. Denies fever, drainage from the wound, numbness, other injuries, or any other complaints.     History reviewed. No pertinent past medical history.  There are no active problems to display for this patient.   Past Surgical History:  Procedure Laterality Date  . ANKLE SURGERY          Home Medications    Prior to Admission medications   Medication Sig Start Date End Date Taking? Authorizing Provider  amphetamine-dextroamphetamine (ADDERALL) 20 MG tablet Take 20 mg by mouth daily.   Yes [provider]  cephALEXin (KEFLEX) 500 MG capsule Take 1 capsule (500 mg total) by mouth 4 (four) times daily. 03/03/19   Sommer Spickard C, PA-C  ciprofloxacin (CIPRO) 500 MG tablet Take 1 tablet (500 mg total) by mouth 2 (two) times daily. 03/03/19   Zackry Deines C, PA-C  ondansetron (ZOFRAN ODT) 4 MG disintegrating tablet Take 1 tablet (4 mg total) by mouth every 8 (eight) hours as needed for nausea or vomiting. 03/03/19   Arionna Hoggard, Hillard DankerShawn C, PA-C    Family History History reviewed. No pertinent family history.  Social History Social History   Tobacco Use   . Smoking status: Current Every Day Smoker    Packs/day: 1.00  . Smokeless tobacco: Never Used  Substance Use Topics  . Alcohol use: Yes  . Drug use: No     Allergies   Darvocet [propoxyphene n-acetaminophen]   Review of Systems Review of Systems  Constitutional: Negative for fever.  Skin: Positive for wound.  Neurological: Negative for weakness and numbness.     Physical Exam Updated Vital Signs BP 120/75   Pulse 86   Temp 97.6 F (36.4 C)   Resp 16   Ht 6\' 3"  (1.905 m)   Wt 115.7 kg   SpO2 100%   BMI 31.87 kg/m   Physical Exam Vitals signs and nursing note reviewed.  Constitutional:      General: He is not in acute distress.    Appearance: He is well-developed. He is not diaphoretic.  HENT:     Head: Normocephalic and atraumatic.  Eyes:     Conjunctiva/sclera: Conjunctivae normal.  Neck:     Musculoskeletal: Neck supple.  Cardiovascular:     Rate and Rhythm: Normal rate and regular rhythm.     Pulses:          Dorsalis pedis pulses are 2+ on the right side and 2+ on the left side.       Posterior tibial pulses are 2+ on the right side  and 2+ on the left side.  Pulmonary:     Effort: Pulmonary effort is normal.  Musculoskeletal:       Feet:  Feet:     Comments: 1 to 2 mm laceration consistent with puncture wound to the right plantar surface of the foot.  No hemorrhage, exudate, swelling, erythema, or tenderness beyond the immediate puncture area. No discernible swelling or erythema elsewhere on the foot. Skin:    General: Skin is warm and dry.     Coloration: Skin is not pale.  Neurological:     Mental Status: He is alert.     Comments: Sensation light touch grossly intact in the right foot. Appropriate motor function in the right toes and ankle.  Psychiatric:        Behavior: Behavior normal.      ED Treatments / Results  Labs (all labs ordered are listed, but only abnormal results are displayed) Labs Reviewed - No data to display  EKG  None  Radiology No results found.  Procedures Procedures (including critical care time)  Medications Ordered in ED Medications  Tdap (BOOSTRIX) injection 0.5 mL (0.5 mLs Intramuscular Given 03/03/19 1906)  cephALEXin (KEFLEX) capsule 500 mg (500 mg Oral Given 03/03/19 2014)  ciprofloxacin (CIPRO) tablet 500 mg (500 mg Oral Given 03/03/19 2014)     Initial Impression / Assessment and Plan / ED Course  I have reviewed the triage vital signs and the nursing notes.  Pertinent labs & imaging results that were available during my care of the patient were reviewed by me and considered in my medical decision making (see chart for details).        Patient presents with a puncture wound to the right foot.  The wound is noted on exam without other abnormalities noted. An x-ray was recommended with explanation, however, patient stated he would prefer to wait on the x-ray, citing financial issues.  We updated his tdap and placed him on prophylactic antibiotic coverage. The patient was given instructions for home care as well as return precautions. Patient voices understanding of these instructions, accepts the plan, and is comfortable with discharge.  Final Clinical Impressions(s) / ED Diagnoses   Final diagnoses:  Puncture wound of right foot, initial encounter    ED Discharge Orders         Ordered    ciprofloxacin (CIPRO) 500 MG tablet  2 times daily     03/03/19 2019    cephALEXin (KEFLEX) 500 MG capsule  4 times daily     03/03/19 2019    ondansetron (ZOFRAN ODT) 4 MG disintegrating tablet  Every 8 hours PRN     03/03/19 2019           Lorayne Bender, PA-C 03/03/19 2050    Lennice Sites, DO 03/03/19 2309

## 2020-01-20 ENCOUNTER — Encounter (HOSPITAL_BASED_OUTPATIENT_CLINIC_OR_DEPARTMENT_OTHER): Payer: Self-pay

## 2020-01-20 ENCOUNTER — Emergency Department (HOSPITAL_BASED_OUTPATIENT_CLINIC_OR_DEPARTMENT_OTHER)
Admission: EM | Admit: 2020-01-20 | Discharge: 2020-01-21 | Disposition: A | Discharge status: Home / Self Care | Payer: 59 | Attending: Emergency Medicine | Admitting: Emergency Medicine

## 2020-01-20 ENCOUNTER — Other Ambulatory Visit: Payer: Self-pay

## 2020-01-20 DIAGNOSIS — Z20822 Contact with and (suspected) exposure to covid-19: Secondary | ICD-10-CM | POA: Insufficient documentation

## 2020-01-20 DIAGNOSIS — J069 Acute upper respiratory infection, unspecified: Secondary | ICD-10-CM | POA: Diagnosis not present

## 2020-01-20 DIAGNOSIS — R42 Dizziness and giddiness: Secondary | ICD-10-CM | POA: Diagnosis present

## 2020-01-20 DIAGNOSIS — F17210 Nicotine dependence, cigarettes, uncomplicated: Secondary | ICD-10-CM | POA: Insufficient documentation

## 2020-01-20 NOTE — ED Triage Notes (Addendum)
Pt with flu like sx x 3 days-no covid vaccine-NAD-steady gait

## 2020-01-21 LAB — SARS CORONAVIRUS 2 BY RT PCR (HOSPITAL ORDER, PERFORMED IN ~~LOC~~ HOSPITAL LAB): SARS Coronavirus 2: NEGATIVE

## 2020-01-21 MED ORDER — HYDROCOD POLST-CPM POLST ER 10-8 MG/5ML PO SUER
5.0000 mL | Freq: Once | ORAL | Status: AC
  Administered 2020-01-21: 5 mL via ORAL
  Filled 2020-01-21: qty 5

## 2020-01-21 MED ORDER — HYDROCOD POLST-CPM POLST ER 10-8 MG/5ML PO SUER: 5.0000 mL | Freq: Two times a day (BID) | ORAL | 0 refills | Status: DC | PRN

## 2020-01-21 NOTE — ED Provider Notes (Signed)
[facility] Provider Note: Lowella Dell, MD, FACEP  CSN: [id number] MRN: [id number] ARRIVAL: 01/20/20 at 2137 ROOM: [facility]08/[facility]08   CHIEF COMPLAINT  Cough   HISTORY OF PRESENT ILLNESS  01/21/20 12:14 AM James Clements is a 39 y.o. male with about a 2 to 3-day history of cough and nasal congestion.  He has had some lightheadedness and body aches as well but these are not severe.  The cough is his principal complaint.  He characterizes it as feeling like allergies.  He has not had a fever.  He has not lost taste or smell.  He has not had the Covid vaccine.   History reviewed. No pertinent past medical history.  Past Surgical History:  Procedure Laterality Date  . ANKLE SURGERY      No family history on file.  Social History   Tobacco Use  . Smoking status: Current Every Day Smoker    Packs/day: 1.00  . Smokeless tobacco: Never Used  Vaping Use  . Vaping Use: Never used  Substance Use Topics  . Alcohol use: Yes    Comment: occ  . Drug use: Yes    Types: Marijuana    Prior to Admission medications   Medication Sig Start Date End Date Taking? Authorizing Provider  amphetamine-dextroamphetamine (ADDERALL) 20 MG tablet Take 20 mg by mouth daily.    [provider]  chlorpheniramine-HYDROcodone (TUSSIONEX PENNKINETIC ER) 10-8 MG/5ML SUER Take 5 mLs by mouth every 12 (twelve) hours as needed. 01/21/20   Cordell Coke, MD    Allergies Darvocet [propoxyphene n-acetaminophen]   REVIEW OF SYSTEMS  Negative except as noted here or in the History of Present Illness.   PHYSICAL EXAMINATION  Initial Vital Signs Blood pressure 114/77, pulse 70, temperature 97.8 F (36.6 C), temperature source Oral, resp. rate 16, weight (!) 111.1 kg, SpO2 98 %.  Examination General: Well-developed, well-nourished male in no acute distress; appearance consistent with age of record HENT: normocephalic; atraumatic Eyes: pupils equal, round and reactive to light; extraocular  muscles intact Neck: supple Heart: regular rate and rhythm Lungs: clear to auscultation bilaterally Abdomen: soft; nondistended; nontender; bowel sounds present Extremities: No deformity; full range of motion; pulses normal Neurologic: Awake, alert and oriented; motor function intact in all extremities and symmetric; no facial droop Skin: Warm and dry Psychiatric: Normal mood and affect   RESULTS  Summary of this visit's results, reviewed and interpreted by myself:   EKG Interpretation  Date/Time:    Ventricular Rate:    PR Interval:    QRS Duration:   QT Interval:    QTC Calculation:   R Axis:     Text Interpretation:        Laboratory Studies: No results found for this or any previous visit (from the past 24 hour(s)). Imaging Studies: No results found.  ED COURSE and MDM  Nursing notes, initial and subsequent vitals signs, including pulse oximetry, reviewed and interpreted by myself.  Vitals:   01/20/20 2149 01/21/20 0006  BP: 117/78 114/77  Pulse: 70 70  Resp: 18 16  Temp: 98 F (36.7 C) 97.8 F (36.6 C)  TempSrc: Oral Oral  SpO2: 100% 98%  Weight: (!) 111.1 kg    Medications  chlorpheniramine-HYDROcodone (TUSSIONEX) 10-8 MG/5ML suspension 5 mL (has no administration in time range)    We will test patient for Covid.  We will provide an antitussive.  His lungs are clear and I have a low index of suspicion of serious disease at this time.  PROCEDURES  Procedures   ED DIAGNOSES     ICD-10-CM   1. Viral URI with cough  J06.9        Karmah Potocki, MD 01/21/20 [phone]

## 2020-02-21 ENCOUNTER — Emergency Department (HOSPITAL_BASED_OUTPATIENT_CLINIC_OR_DEPARTMENT_OTHER): Payer: 59

## 2020-02-21 ENCOUNTER — Emergency Department (HOSPITAL_BASED_OUTPATIENT_CLINIC_OR_DEPARTMENT_OTHER)
Admission: EM | Admit: 2020-02-21 | Discharge: 2020-02-21 | Discharge status: Against Medical Advice | Payer: 59 | Attending: Emergency Medicine | Admitting: Emergency Medicine

## 2020-02-21 ENCOUNTER — Other Ambulatory Visit: Payer: Self-pay

## 2020-02-21 ENCOUNTER — Encounter (HOSPITAL_BASED_OUTPATIENT_CLINIC_OR_DEPARTMENT_OTHER): Payer: Self-pay | Admitting: Emergency Medicine

## 2020-02-21 DIAGNOSIS — J069 Acute upper respiratory infection, unspecified: Secondary | ICD-10-CM | POA: Diagnosis not present

## 2020-02-21 DIAGNOSIS — Z20822 Contact with and (suspected) exposure to covid-19: Secondary | ICD-10-CM | POA: Insufficient documentation

## 2020-02-21 DIAGNOSIS — R05 Cough: Secondary | ICD-10-CM | POA: Diagnosis present

## 2020-02-21 DIAGNOSIS — F17200 Nicotine dependence, unspecified, uncomplicated: Secondary | ICD-10-CM | POA: Diagnosis not present

## 2020-02-21 DIAGNOSIS — Z720 Tobacco use: Secondary | ICD-10-CM

## 2020-02-21 DIAGNOSIS — Z79899 Other long term (current) drug therapy: Secondary | ICD-10-CM | POA: Diagnosis not present

## 2020-02-21 LAB — SARS CORONAVIRUS 2 BY RT PCR (HOSPITAL ORDER, PERFORMED IN ~~LOC~~ HOSPITAL LAB): SARS Coronavirus 2: NEGATIVE

## 2020-02-21 MED ORDER — PREDNISONE 10 MG PO TABS: 20.0000 mg | ORAL_TABLET | Freq: Every day | ORAL | 0 refills | Status: DC

## 2020-02-21 MED ORDER — BENZONATATE 100 MG PO CAPS: 100.0000 mg | ORAL_CAPSULE | Freq: Three times a day (TID) | ORAL | 0 refills | Status: DC

## 2020-02-21 MED ORDER — ALBUTEROL SULFATE HFA 108 (90 BASE) MCG/ACT IN AERS: 1.0000 | INHALATION_SPRAY | Freq: Four times a day (QID) | RESPIRATORY_TRACT | 0 refills | Status: DC | PRN

## 2020-02-21 MED ORDER — ZYRTEC ALLERGY 10 MG PO CAPS: ORAL_CAPSULE | ORAL | 0 refills | Status: DC

## 2020-02-21 MED ORDER — FLUTICASONE PROPIONATE 50 MCG/ACT NA SUSP: 2.0000 | Freq: Every day | NASAL | 0 refills | Status: DC

## 2020-02-21 NOTE — ED Notes (Signed)
Pt was in the waiting room asleep with headphones on and did not hear the nurse call him for triage.

## 2020-02-21 NOTE — Discharge Instructions (Addendum)
Use the albuterol inhaler for any shortness of breath  Tessalon Perles for cough  Cetirizine for congestion and runny nose  Flonase for runny nose  Prednisone for Upper respiratory infection  Covid test is pending, if positive you need to be out of work for 10 days I have written you a note for this in case this is positive  Return for new or worsening symptoms otherwise follow-up with primary care provider

## 2020-02-21 NOTE — ED Notes (Signed)
Approx one month ago was here for a resp infection. Works in a dusty environment. Today presents with prod cough, very thick mucous. States he does not have DOE.

## 2020-02-21 NOTE — ED Notes (Signed)
Appears in no resp distress at this time. Speech wnl, color wnl

## 2020-02-21 NOTE — ED Provider Notes (Signed)
[facility] EMERGENCY DEPARTMENT Provider Note   CSN: [id number] Arrival date & time: 02/21/20  1133    History Chief Complaint  Patient presents with  . Shortness of Breath    James Clements is a 39 y.o. male with past medical history who presents for evaluation of cough.  States he was seen 1 month ago for a "respiratory infection."  Was started on cough medicine.  Patient states that helped however he continues with cough.  Cough productive of yellow sputum.  Has some rhinorrhea and some congestion.  No sore throat, headache, lightheadedness or dizziness.  He is not vaccinated against Covid.  No myalgias, chest pain, shortness of breath, hemoptysis abdominal pain, diarrhea, dysuria.  He is not on any medication currently for his symptoms.  He has not followed up otherwise for this.  He is a daily tobacco user.  No prior history of PE or DVT.  No unilateral leg swelling, redness or warmth, recent surgery, malignancy, exogenous hormone use.  Denies any known Covid exposures.  Significant other not sick.  Denies additional aggravating or alleviating factors.  History obtained from patient and past medical records.  No interpreter is used.  HPI     History reviewed. No pertinent past medical history.  There are no problems to display for this patient.   Past Surgical History:  Procedure Laterality Date  . ANKLE SURGERY         No family history on file.  Social History   Tobacco Use  . Smoking status: Current Every Day Smoker    Packs/day: 1.00  . Smokeless tobacco: Never Used  Vaping Use  . Vaping Use: Never used  Substance Use Topics  . Alcohol use: Yes    Comment: occ  . Drug use: Yes    Types: Marijuana    Home Medications Prior to Admission medications   Medication Sig Start Date End Date Taking? Authorizing Provider  albuterol (VENTOLIN HFA) 108 (90 Base) MCG/ACT inhaler Inhale 1-2 puffs into the lungs every 6 (six) hours as needed for wheezing or  shortness of breath. 02/21/20   Heidemarie Goodnow A, PA-C  amphetamine-dextroamphetamine (ADDERALL) 20 MG tablet Take 20 mg by mouth daily.    [provider]  benzonatate (TESSALON) 100 MG capsule Take 1 capsule (100 mg total) by mouth every 8 (eight) hours. 02/21/20   Donel Osowski A, PA-C  Cetirizine HCl (ZYRTEC ALLERGY) 10 MG CAPS Take one tablet daily 02/21/20   Audria Takeshita A, PA-C  chlorpheniramine-HYDROcodone (TUSSIONEX PENNKINETIC ER) 10-8 MG/5ML SUER Take 5 mLs by mouth every 12 (twelve) hours as needed. 01/21/20   Molpus, John, MD  fluticasone (FLONASE) 50 MCG/ACT nasal spray Place 2 sprays into both nostrils daily. 02/21/20   Yoana Staib A, PA-C  predniSONE (DELTASONE) 10 MG tablet Take 2 tablets (20 mg total) by mouth daily. 02/21/20   Beau Vanduzer A, PA-C    Allergies    Darvocet [propoxyphene n-acetaminophen]  Review of Systems   Review of Systems  Constitutional: Positive for fatigue. Negative for activity change, appetite change, chills, diaphoresis, fever and unexpected weight change.  HENT: Positive for congestion and rhinorrhea.   Respiratory: Positive for cough. Negative for apnea, choking, chest tightness, shortness of breath, wheezing and stridor.   Cardiovascular: Negative.   Gastrointestinal: Negative.   Genitourinary: Negative.   Musculoskeletal: Negative.   Skin: Negative.   Neurological: Negative.   All other systems reviewed and are negative.   Physical Exam Updated Vital Signs BP Marland Kitchen(!)  121/91 (BP Location: Right Arm)   Pulse 75   Temp 98.4 F (36.9 C) (Oral)   Resp 14   SpO2 100%   Physical Exam Vitals and nursing note reviewed.  Constitutional:      General: He is not in acute distress.    Appearance: He is not ill-appearing, toxic-appearing or diaphoretic.  HENT:     Head: Normocephalic and atraumatic.     Jaw: There is normal jaw occlusion.     Right Ear: Tympanic membrane, ear canal and external ear normal. There is no  impacted cerumen. No hemotympanum. Tympanic membrane is not injected, scarred, perforated, erythematous, retracted or bulging.     Left Ear: Tympanic membrane, ear canal and external ear normal. There is no impacted cerumen. No hemotympanum. Tympanic membrane is not injected, scarred, perforated, erythematous, retracted or bulging.     Ears:     Comments: No Mastoid tenderness.    Nose:     Comments: Clear rhinorrhea and congestion to bilateral nares.  No sinus tenderness.    Mouth/Throat:     Comments: Posterior oropharynx clear.  Mucous membranes moist.  Tonsils without erythema or exudate.  Uvula midline without deviation.  No evidence of PTA or RPA.  No drooling, dysphasia or trismus.  Phonation normal. Neck:     Trachea: Trachea and phonation normal.     Meningeal: Brudzinski's sign and Kernig's sign absent.     Comments: No Neck stiffness or neck rigidity.  No meningismus.  No cervical lymphadenopathy. Cardiovascular:     Comments: No murmurs rubs or gallops. Pulmonary:     Comments: Clear to auscultation bilaterally without wheeze, rhonchi or rales.  No accessory muscle usage.  Able speak in full sentences. Wet cough on exam Abdominal:     Comments: Soft, nontender without rebound or guarding.  No CVA tenderness.  Musculoskeletal:     Comments: Moves all 4 extremities without difficulty.  Lower extremities without edema, erythema or warmth.  Skin:    Comments: Brisk capillary refill.  No rashes or lesions.  Neurological:     Mental Status: He is alert.     Comments: Ambulatory in department without difficulty.  Cranial nerves II through XII grossly intact.  No facial droop.  No aphasia.    ED Results / Procedures / Treatments   Labs (all labs ordered are listed, but only abnormal results are displayed) Labs Reviewed  SARS CORONAVIRUS 2 BY RT PCR (HOSPITAL ORDER, PERFORMED IN [facility] LAB)    EKG None  Radiology DG Chest Portable 1 View  Result Date:  02/21/2020 CLINICAL DATA:  Cough EXAM: PORTABLE CHEST 1 VIEW COMPARISON:  None. FINDINGS: The heart size and mediastinal contours are within normal limits. Both lungs are clear. No acute osseous abnormality. IMPRESSION: No focal airspace disease. Electronically Signed   By: Stana Bunting M.D.   On: 02/21/2020 14:01    Procedures Procedures (including critical care time)  Medications Ordered in ED Medications - No data to display  ED Course  I have reviewed the triage vital signs and the nursing notes.  Pertinent labs & imaging results that were available during my care of the patient were reviewed by me and considered in my medical decision making (see chart for details).  39 year old presents for evaluation of cough.  Cough x1 month.  He is afebrile, nonseptic, non-ill-appearing.  Was seen here previously had negative Covid test diagnosed upper respiratory infection.  He is not any chest imaging.  He is a  chronic daily smoker.  No associated hemoptysis, chest pain, shortness of breath.  No unilateral leg swelling, redness, warmth, recent surgery, mobilization or malignancy.  Patient without tachycardia, tachypnea or hypoxia.  He is Wells criteria low risk.  Clinically no evidence of DVT on exam.  Does have clear rhinorrhea to bilateral nares.  Posterior oropharynx clear.  No murmur on exam.  Heart and lungs clear however with wet cough on exam. Plan on chest x-ray, Covid test.  Patient ambulatory in room without difficulty, actively eating on evaluation.  Appears otherwise well.  No chest pain, shortness of breath to suggest ACS, PE, dissection, pneumothorax.   Chest xray negative for infiltrates, cardiomegaly, palmar edema, pneumothorax COVID pending  Reassessed.  Appears overall well.  Ambulatory without any hypoxia.  Question bronchitis as source of his symptoms.  His Covid test is pending.  Will treat symptomatically.  He will return for any worsening symptoms.  The patient has been  appropriately medically screened and/or stabilized in the ED. I have low suspicion for any other emergent medical condition which would require further screening, evaluation or treatment in the ED or require inpatient management.  Patient is hemodynamically stable and in no acute distress.  Patient able to ambulate in department prior to ED.  Evaluation does not show acute pathology that would require ongoing or additional emergent interventions while in the emergency department or further inpatient treatment.  I have discussed the diagnosis with the patient and answered all questions.  Pain is been managed while in the emergency department and patient has no further complaints prior to discharge.  Patient is comfortable with plan discussed in room and is stable for discharge at this time.  I have discussed strict return precautions for returning to the emergency department.  Patient was encouraged to follow-up with PCP/specialist refer to at discharge.    MDM Rules/Calculators/A&P                          Breckin Savannah was evaluated in Emergency Department on 02/21/2020 for the symptoms described in the history of present illness. He was evaluated in the context of the global COVID-19 pandemic, which necessitated consideration that the patient might be at risk for infection with the SARS-CoV-2 virus that causes COVID-19. Institutional protocols and algorithms that pertain to the evaluation of patients at risk for COVID-19 are in a state of rapid change based on information released by regulatory bodies including the CDC and federal and state organizations. These policies and algorithms were followed during the patient's care in the ED. Final Clinical Impression(s) / ED Diagnoses Final diagnoses:  Viral upper respiratory tract infection  Tobacco user  Person under investigation for COVID-19    Rx / DC Orders ED Discharge Orders         Ordered    benzonatate (TESSALON) 100 MG capsule  Every 8 hours         02/21/20 1414    fluticasone (FLONASE) 50 MCG/ACT nasal spray  Daily        02/21/20 1414    Cetirizine HCl (ZYRTEC ALLERGY) 10 MG CAPS        02/21/20 1414    predniSONE (DELTASONE) 10 MG tablet  Daily        02/21/20 1414    albuterol (VENTOLIN HFA) 108 (90 Base) MCG/ACT inhaler  Every 6 hours PRN        02/21/20 1414  Teliyah Royal A, PA-C 02/21/20 1417    Milagros Loll, MD 02/24/20 [phone]

## 2020-02-21 NOTE — ED Notes (Signed)
Covid Swab obtained 

## 2020-02-21 NOTE — ED Triage Notes (Signed)
Pt was found asleep in lobby, reports was diagnosed with "respiratory infection". "don't feel good today". girlfriend brought him to ER. Alert and oriented x 4. No distress.

## 2020-03-16 ENCOUNTER — Emergency Department (HOSPITAL_BASED_OUTPATIENT_CLINIC_OR_DEPARTMENT_OTHER): Payer: 59

## 2020-03-16 ENCOUNTER — Encounter (HOSPITAL_BASED_OUTPATIENT_CLINIC_OR_DEPARTMENT_OTHER): Payer: Self-pay | Admitting: *Deleted

## 2020-03-16 ENCOUNTER — Other Ambulatory Visit: Payer: Self-pay

## 2020-03-16 ENCOUNTER — Emergency Department (HOSPITAL_BASED_OUTPATIENT_CLINIC_OR_DEPARTMENT_OTHER)
Admission: EM | Admit: 2020-03-16 | Discharge: 2020-03-16 | Disposition: A | Discharge status: Home / Self Care | Payer: 59 | Attending: Emergency Medicine | Admitting: Emergency Medicine

## 2020-03-16 DIAGNOSIS — R0981 Nasal congestion: Secondary | ICD-10-CM | POA: Insufficient documentation

## 2020-03-16 DIAGNOSIS — R509 Fever, unspecified: Secondary | ICD-10-CM | POA: Insufficient documentation

## 2020-03-16 DIAGNOSIS — F17210 Nicotine dependence, cigarettes, uncomplicated: Secondary | ICD-10-CM | POA: Insufficient documentation

## 2020-03-16 DIAGNOSIS — R05 Cough: Secondary | ICD-10-CM | POA: Insufficient documentation

## 2020-03-16 DIAGNOSIS — Z20822 Contact with and (suspected) exposure to covid-19: Secondary | ICD-10-CM | POA: Diagnosis not present

## 2020-03-16 DIAGNOSIS — R5383 Other fatigue: Secondary | ICD-10-CM | POA: Diagnosis not present

## 2020-03-16 LAB — RESPIRATORY PANEL BY RT PCR (FLU A&B, COVID)
Influenza A by PCR: NEGATIVE
Influenza B by PCR: NEGATIVE
SARS Coronavirus 2 by RT PCR: NEGATIVE

## 2020-03-16 NOTE — ED Triage Notes (Signed)
Pt c/o covid symptoms x 2 days

## 2020-03-16 NOTE — ED Provider Notes (Signed)
[facility] EMERGENCY DEPARTMENT Provider Note   CSN: [id number] Arrival date & time: 03/16/20  1528     History Chief Complaint  Patient presents with   Covid Exposure    Ebb Carelock is a 39 y.o. male who presents for evaluation of concern of possible Covid exposure.  He reports that approximately 4 days ago, he was exposed to somebody who has Covid.  He states he is not vaccinated.  He states over the last couple days, he has had some fatigue, decreased energy, rhinorrhea, congestion.  He is also had some cough that is productive of phlegm.  No chest pain, difficulty breathing.  He has not measured any fevers but states he has had some subjective fever and chills.  He does smoke approximately half a pack of cigarettes a day.  No history of asthma.  The history is provided by the patient.       History reviewed. No pertinent past medical history.  There are no problems to display for this patient.   Past Surgical History:  Procedure Laterality Date   ANKLE SURGERY         No family history on file.  Social History   Tobacco Use   Smoking status: Current Every Day Smoker    Packs/day: 1.00    Types: Cigarettes   Smokeless tobacco: Never Used  Vaping Use   Vaping Use: Never used  Substance Use Topics   Alcohol use: Yes    Comment: occ   Drug use: Yes    Types: Marijuana    Home Medications Prior to Admission medications   Medication Sig Start Date End Date Taking? Authorizing Provider  albuterol (VENTOLIN HFA) 108 (90 Base) MCG/ACT inhaler Inhale 1-2 puffs into the lungs every 6 (six) hours as needed for wheezing or shortness of breath. 02/21/20   Henderly, Britni A, PA-C  amphetamine-dextroamphetamine (ADDERALL) 20 MG tablet Take 20 mg by mouth daily.    [provider]  benzonatate (TESSALON) 100 MG capsule Take 1 capsule (100 mg total) by mouth every 8 (eight) hours. 02/21/20   Henderly, Britni A, PA-C  Cetirizine HCl (ZYRTEC ALLERGY)  10 MG CAPS Take one tablet daily 02/21/20   Henderly, Britni A, PA-C  chlorpheniramine-HYDROcodone (TUSSIONEX PENNKINETIC ER) 10-8 MG/5ML SUER Take 5 mLs by mouth every 12 (twelve) hours as needed. 01/21/20   Molpus, John, MD  fluticasone (FLONASE) 50 MCG/ACT nasal spray Place 2 sprays into both nostrils daily. 02/21/20   Henderly, Britni A, PA-C  predniSONE (DELTASONE) 10 MG tablet Take 2 tablets (20 mg total) by mouth daily. 02/21/20   Henderly, Britni A, PA-C    Allergies    Darvocet [propoxyphene n-acetaminophen]  Review of Systems   Review of Systems  Constitutional: Positive for chills, fatigue and fever (subjective). Negative for activity change.  HENT: Positive for congestion and rhinorrhea.   Respiratory: Positive for cough. Negative for shortness of breath.   Cardiovascular: Negative for chest pain.  Gastrointestinal: Negative for abdominal pain, nausea and vomiting.  Neurological: Negative for headaches.  All other systems reviewed and are negative.   Physical Exam Updated Vital Signs BP 120/76    Pulse 81    Temp 97.6 F (36.4 C) (Oral)    Resp 18    Ht 6\' 3"  (1.905 m)    Wt 108.9 kg    SpO2 97%    BMI 30.00 kg/m   Physical Exam Vitals and nursing note reviewed.  Constitutional:      Appearance:  He is well-developed.  HENT:     Head: Normocephalic and atraumatic.  Eyes:     General: No scleral icterus.       Right eye: No discharge.        Left eye: No discharge.     Conjunctiva/sclera: Conjunctivae normal.  Pulmonary:     Effort: Pulmonary effort is normal.     Comments: Lungs clear to auscultation bilaterally.  Symmetric chest rise.  No wheezing, rales, rhonchi. Skin:    General: Skin is warm and dry.  Neurological:     Mental Status: He is alert.  Psychiatric:        Speech: Speech normal.        Behavior: Behavior normal.     ED Results / Procedures / Treatments   Labs (all labs ordered are listed, but only abnormal results are displayed) Labs Reviewed   RESPIRATORY PANEL BY RT PCR (FLU A&B, COVID)    EKG None  Radiology DG Chest Portable 1 View  Result Date: 03/16/2020 CLINICAL DATA:  Cough.  COVID exposure EXAM: PORTABLE CHEST 1 VIEW COMPARISON:  02/21/2020 FINDINGS: The heart size and mediastinal contours are within normal limits. Both lungs are clear. The visualized skeletal structures are unremarkable. IMPRESSION: No active disease. Electronically Signed   By: Marlan Palau M.D.   On: 03/16/2020 15:54    Procedures Procedures (including critical care time)  Medications Ordered in ED Medications - No data to display  ED Course  I have reviewed the triage vital signs and the nursing notes.  Pertinent labs & imaging results that were available during my care of the patient were reviewed by me and considered in my medical decision making (see chart for details).    MDM Rules/Calculators/A&P                          39 year old male presents for evaluation of Covid exposure.  Reports he has had some cough, nasal congestion, rhinorrhea last couple days.  Reports subjective fever chills but has not actually measured a fever.  No history of asthma.  He is unvaccinated.  On initially arrival, he is afebrile, nontoxic-appearing.  Vital signs are stable.  We will plan for COVID-19 testing.  Chest x-ray reviewed.  Negative for any acute abnormality.  Covid test is negative.  Discussed results with patient.  I did discuss with him that if he is continue to have symptoms, he needs to get retested and this could be a false negative.  Encouraged at home supportive care measures. At this time, patient exhibits no emergent life-threatening condition that require further evaluation in ED. Patient had ample opportunity for questions and discussion. All patient's questions were answered with full understanding. Strict return precautions discussed. Patient expresses understanding and agreement to plan.   Lovel Suazo was evaluated in Emergency  Department on 03/16/2020 for the symptoms described in the history of present illness. He was evaluated in the context of the global COVID-19 pandemic, which necessitated consideration that the patient might be at risk for infection with the SARS-CoV-2 virus that causes COVID-19. Institutional protocols and algorithms that pertain to the evaluation of patients at risk for COVID-19 are in a state of rapid change based on information released by regulatory bodies including the CDC and federal and state organizations. These policies and algorithms were followed during the patient's care in the ED.  Portions of this note were generated with Scientist, clinical (histocompatibility and immunogenetics). Dictation errors may occur despite  best attempts at proofreading.   Final Clinical Impression(s) / ED Diagnoses Final diagnoses:  Exposure to COVID-19 virus    Rx / DC Orders ED Discharge Orders    None       Rosana Hoes 03/16/20 1728    Arby Barrette, MD 03/26/20 1219

## 2020-03-16 NOTE — Discharge Instructions (Addendum)
History discussed, your Covid test today was negative.  You continue of symptoms, you will need to be retested to ensure that this is not a false negative.  Return the emergency department for any difficulty breathing, chest pain, vomiting or any other worsening concerning symptoms.

## 2021-03-10 ENCOUNTER — Other Ambulatory Visit: Payer: Self-pay

## 2021-03-10 ENCOUNTER — Ambulatory Visit (HOSPITAL_COMMUNITY)
Admission: EM | Admit: 2021-03-10 | Discharge: 2021-03-10 | Disposition: A | Discharge status: Home / Self Care | Payer: No Payment, Other | Attending: Behavioral Health | Admitting: Behavioral Health

## 2021-03-10 DIAGNOSIS — F32A Depression, unspecified: Secondary | ICD-10-CM | POA: Insufficient documentation

## 2021-03-10 DIAGNOSIS — F488 Other specified nonpsychotic mental disorders: Secondary | ICD-10-CM | POA: Insufficient documentation

## 2021-03-10 DIAGNOSIS — F410 Panic disorder [episodic paroxysmal anxiety] without agoraphobia: Secondary | ICD-10-CM | POA: Insufficient documentation

## 2021-03-10 DIAGNOSIS — F411 Generalized anxiety disorder: Secondary | ICD-10-CM | POA: Insufficient documentation

## 2021-03-10 MED ORDER — HYDROXYZINE PAMOATE 25 MG PO CAPS: 25.0000 mg | ORAL_CAPSULE | Freq: Three times a day (TID) | ORAL | 0 refills | Status: AC | PRN

## 2021-03-10 NOTE — BH Assessment (Signed)
James Clements, Routine, MR #[id number]; 40 years old presents this date with his friend Lebron Quam, [phone].  Pt denies SI, HI or AVH.  Pt reports prior thoughts of suicide, with no plan.  Pt reports feelings of depression.  Pt admits to prior MH diagnosis; also, is not currently taking prescribed medication for symptom management.  MSE signed by patient.

## 2021-03-10 NOTE — ED Provider Notes (Signed)
Behavioral Health Urgent Care Medical Screening Exam  Patient Name: James Clements MRN: [id number] Date of Evaluation: 03/10/21 Diagnosis:  Final diagnoses:  Depression, unspecified depression type  Anxiety state    History of Present illness: Antionio Negron is a 40 y.o. male Patient presents to the [facility] voluntarily as a walk-in accompanied by friend Lebron Quam with a chief complaint of "feelings of depression."  Patient seen and examined face to face and chart review by this provider. On evaluation, patient is alert and oriented x4. His thought process is logical and speech is coherent. His mood is dysphoric and affect is congruent. He appears well groomed and is casually dressed.  He reports having a nervous breakdown over the past couple weeks and has been shutting down. He reports that he feels overwhelmed in general and has been depressed for a couple weeks. He reports that he is starting to spiral down and cannot deal with it. He denies any stressor or triggers. He describes his depressive symptoms as feeling sad, worthless, hopeless, decreased energy level, crying spells, irritability, a lack of pleasure in activities and sleeping a lot during the day.  He describes his anxiety symptoms as excessive worrying, decreased concentration, feeling nervous, sweating, heart beating fast, and a sense of danger when he is having a panic attack. He reports feeling previously suicidal weeks ago but he did not have a plan or intent. He states that he is not having any suicidal thoughts today and is able to contract for safety. He denies homicidal ideations. He denies auditory and visual hallucinations. He does not appear to be responding to internal or external stimuli. He reports drinking alcohol occasionally, on average monthly 2-3 drinks.He denies using illicit drugs.  He reports that he resides with a roommate. He states that he does not have a current psychiatrist or  therapist. He denies prior psychiatric hospitalizations.   I discussed starting Vistaril 25 mg 3 times a day as needed for anxiety/panic attacks.  I discussed the benefits and risks of taking Vistaril. We discussed the patient following up at the [facility] outpatient for medication management and therapy for depression. Patient agrees to the stated treatment plan and verbalizes understanding.   Psychiatric Specialty Exam   Presentation  General Appearance:Appropriate for Environment  Eye Contact:Fair  Speech:Clear and Coherent  Speech Volume:Normal  Handedness:No data recorded  Mood and Affect  Mood:Dysphoric  Affect:Congruent   Thought Process  Thought Processes:Coherent; Goal Directed  Descriptions of Associations:Intact  Orientation:Full (Time, Place and Person)  Thought Content:Logical    Hallucinations:None  Ideas of Reference:None  Suicidal Thoughts:No  Homicidal Thoughts:No   Sensorium  Memory:Immediate Fair; Remote Fair; Recent Fair  Judgment:Fair  Insight:Fair   Executive Functions  Concentration:Fair  Attention Span:Fair  Recall:Fair  Fund of Knowledge:Fair  Language:Fair   Psychomotor Activity  Psychomotor Activity:Normal   Assets  Assets:Communication Skills; Desire for Improvement; Housing; Leisure Time; Physical Health; Social Support   Sleep  Sleep:Fair  Number of hours: 12   Physical Exam: Physical Exam HENT:     Head: Normocephalic.     Nose: Nose normal.  Eyes:     Conjunctiva/sclera: Conjunctivae normal.  Cardiovascular:     Rate and Rhythm: Normal rate.  Pulmonary:     Effort: Pulmonary effort is normal.  Musculoskeletal:        General: Normal range of motion.     Cervical back: Normal range of motion.  Neurological:     Mental  Status: He is alert.   Review of Systems  Constitutional: Negative.   HENT: Negative.    Eyes: Negative.   Respiratory: Negative.    Cardiovascular:  Negative.   Gastrointestinal: Negative.   Genitourinary: Negative.   Musculoskeletal: Negative.   Skin: Negative.   Neurological: Negative.   Endo/Heme/Allergies: Negative.   Psychiatric/Behavioral:  Positive for depression. The patient is nervous/anxious.   Blood pressure 121/82, pulse 83, temperature 98.9 F (37.2 C), temperature source Oral, resp. rate 18, height 6\' 2"  (1.88 m), weight 235 lb (106.6 kg), SpO2 99 %. Body mass index is 30.17 kg/m.  Musculoskeletal: Strength & Muscle Tone: within normal limits Gait & Station: normal Patient leans: N/A   [facility] MSE Discharge Disposition for Follow up and Recommendations: Based on my evaluation the patient does not appear to have an emergency medical condition and can be discharged with resources and follow up care in outpatient services for Medication Management, Individual Therapy, and Group Therapy  Medications:  Vistaril 25 mg po TID for anxiety x 30 tablets sent electronically to pharmacy.  Outpatient follow up:  Follow-up Information     Go to  [facility].   Specialty: Urgent Care Why: Open access walk-in hours Monday - Thursday 8 am to 11 am for medication management and therapy. Contact information: [phone] [place] [phone]                 [phone], NP 03/10/2021, 12:52 PM

## 2021-03-10 NOTE — Discharge Summary (Signed)
James Clements to be D/C'd Home per NP order. Discussed with the patient and all questions fully answered. An After Visit Summary was printed and given to the patient. Patient escorted out and D/C home via private auto.  James Clements  03/10/2021 12:59 PM

## 2021-03-10 NOTE — Discharge Instructions (Signed)

## 2022-08-19 ENCOUNTER — Emergency Department (HOSPITAL_BASED_OUTPATIENT_CLINIC_OR_DEPARTMENT_OTHER): Payer: 59

## 2022-08-19 ENCOUNTER — Emergency Department (HOSPITAL_BASED_OUTPATIENT_CLINIC_OR_DEPARTMENT_OTHER)
Admission: EM | Admit: 2022-08-19 | Discharge: 2022-08-19 | Disposition: A | Discharge status: Home / Self Care | Payer: 59 | Attending: Emergency Medicine | Admitting: Emergency Medicine

## 2022-08-19 ENCOUNTER — Encounter (HOSPITAL_BASED_OUTPATIENT_CLINIC_OR_DEPARTMENT_OTHER): Payer: Self-pay | Admitting: Urology

## 2022-08-19 ENCOUNTER — Other Ambulatory Visit: Payer: Self-pay

## 2022-08-19 DIAGNOSIS — N50812 Left testicular pain: Secondary | ICD-10-CM | POA: Diagnosis present

## 2022-08-19 DIAGNOSIS — N451 Epididymitis: Secondary | ICD-10-CM | POA: Insufficient documentation

## 2022-08-19 MED ORDER — LEVOFLOXACIN 500 MG PO TABS: 500.0000 mg | ORAL_TABLET | Freq: Every day | ORAL | 0 refills | Status: AC

## 2022-08-19 NOTE — ED Notes (Signed)
Pt. Reports he came here tonight after already being seen before for the same pain in the L testicle.  Pt. Has had a [place] and blood work for the L testicle pain

## 2022-08-19 NOTE — ED Notes (Signed)
Dx with epididymitis on 2/20 but left prior to antibiotic treatment

## 2022-08-19 NOTE — ED Triage Notes (Signed)
Pt states hernia to left scrotal area x 2 months  Seen at [facility] and had [place] but LWBS  Pain with movement

## 2022-08-19 NOTE — ED Provider Notes (Signed)
[facility] Provider Note   CSN: [id number] Arrival date & time: 08/19/22  1820     History  Chief Complaint  Patient presents with   Testicle Pain    James Clements is a 42 y.o. male.  Patient presents the emergency department complaining of left-sided testicular pain which been ongoing for 2 months.  Patient was seen last week at an urgent care with a subsequent visit at the emergency department for the same.  There was an ultrasound performed at the emergency department but the patient left prior to being seen by the emergency department providers.  He denies fevers, nausea, vomiting, urinary symptoms.  No relevant past medical history on file  HPI     Home Medications Prior to Admission medications   Medication Sig Start Date End Date Taking? Authorizing Provider  levofloxacin (LEVAQUIN) 500 MG tablet Take 1 tablet (500 mg total) by mouth daily for 10 days. 08/19/22 08/29/22 Yes Dorothyann Peng, PA-C  hydrOXYzine (VISTARIL) 25 MG capsule Take 1 capsule (25 mg total) by mouth 3 (three) times daily as needed for anxiety. 03/10/21   White, Mellody Life, NP      Allergies    Darvocet [propoxyphene n-acetaminophen]    Review of Systems   Review of Systems  Genitourinary:  Positive for testicular pain.    Physical Exam Updated Vital Signs BP 123/81 (BP Location: Left Arm)   Pulse 86   Temp 97.7 F (36.5 C)   Resp 18   Ht '6\' 2"'$  (1.88 m)   Wt 108.9 kg   SpO2 99%   BMI 30.81 kg/m  Physical Exam HENT:     Head: Normocephalic and atraumatic.  Eyes:     Pupils: Pupils are equal, round, and reactive to light.  Pulmonary:     Effort: Pulmonary effort is normal. No respiratory distress.  Genitourinary:    Testes:        Left: Tenderness present.  Musculoskeletal:        General: No signs of injury.     Cervical back: Normal range of motion.  Skin:    General: Skin is dry.  Neurological:     Mental Status: He is alert.   Psychiatric:        Speech: Speech normal.        Behavior: Behavior normal.     ED Results / Procedures / Treatments   Labs (all labs ordered are listed, but only abnormal results are displayed) Labs Reviewed - No data to display  EKG None  Radiology US SCROTUM W/DOPPLER  Result Date: 08/19/2022 CLINICAL DATA:  Reported diagnosis of left epididymitis at outside facility 6 days ago, no treatment EXAM: SCROTAL ULTRASOUND DOPPLER ULTRASOUND OF THE TESTICLES TECHNIQUE: Complete ultrasound examination of the testicles, epididymis, and other scrotal structures was performed. Color and spectral Doppler ultrasound were also utilized to evaluate blood flow to the testicles. COMPARISON:  None Available. FINDINGS: Right testicle Measurements: 6.3 by 2.9 by 3.9 cm. No mass or microlithiasis visualized. Left testicle Measurements: 5.7 by 3.2 by 3.9 cm. No mass or microlithiasis visualized. Right epididymis:  Normal in size and appearance. Left epididymis: Prominent and heterogeneous favoring epididymitis, including along the epididymal tail Hydrocele:  None visualized. Varicocele:  None visualized. Pulsed Doppler interrogation of both testes demonstrates normal low resistance arterial and venous waveforms bilaterally. No well-defined inguinal hernia is observed on today's exam. IMPRESSION: 1. Prominent and heterogeneous left epididymis favoring epididymitis, including along the epididymal tail.  2. No well-defined inguinal hernia is observed on today's exam. Electronically Signed   By: Van Clines M.D.   On: 08/19/2022 19:35    Procedures Procedures    Medications Ordered in ED Medications - No data to display  ED Course/ Medical Decision Making/ A&P                             Medical Decision Making Amount and/or Complexity of Data Reviewed Radiology: ordered.  Risk Prescription drug management.   Patient presents with a chief complaint of left-sided scrotal/testicular pain.   Differential diagnosis includes but is not limited to epididymitis, torsion, abscess, hernia, others  I reviewed the patient's past medical history including notes from [facility] from last week.  Notes suggested possible fat-containing hernia along with epididymitis.  Patient left prior to being seen.  Multiple attempts were made to contact the patient to start Rocephin without success  I ordered and personally interpreted imaging today including an ultrasound of the scrotum with Doppler.  Results were consistent with epididymitis.  No hernia appreciated.  Patient's history and physical combined with imaging are consistent with epididymitis of the left epididymis.  Plan to discharge patient home with Levaquin prescription.  Patient denies recent sexual activity or risk factors for STIs at this time.  Patient plans to establish care with a primary care provider in the near future.  No abscess, torsion, or hernia appreciated on ultrasound.        Final Clinical Impression(s) / ED Diagnoses Final diagnoses:  Epididymitis    Rx / DC Orders ED Discharge Orders          Ordered    levofloxacin (LEVAQUIN) 500 MG tablet  Daily        08/19/22 1942              Ronny Bacon 08/19/22 1950    Audley Hose, MD 08/19/22 2039

## 2022-08-19 NOTE — Discharge Instructions (Addendum)
You were evaluated today for left-sided testicular pain.  This is consistent with epididymitis.  I have sent a prescription to the pharmacy.  Please complete the entire course of antibiotics.  Please follow-up as needed.  You may contact the number in the paperwork for help with establishing care with a primary care provider.

## 2022-09-22 ENCOUNTER — Encounter (HOSPITAL_BASED_OUTPATIENT_CLINIC_OR_DEPARTMENT_OTHER): Payer: Self-pay

## 2022-09-22 ENCOUNTER — Emergency Department (HOSPITAL_BASED_OUTPATIENT_CLINIC_OR_DEPARTMENT_OTHER): Payer: 59

## 2022-09-22 ENCOUNTER — Emergency Department (HOSPITAL_BASED_OUTPATIENT_CLINIC_OR_DEPARTMENT_OTHER)
Admission: EM | Admit: 2022-09-22 | Discharge: 2022-09-22 | Disposition: A | Discharge status: Home / Self Care | Payer: 59 | Attending: Emergency Medicine | Admitting: Emergency Medicine

## 2022-09-22 DIAGNOSIS — N491 Inflammatory disorders of spermatic cord, tunica vaginalis and vas deferens: Secondary | ICD-10-CM | POA: Insufficient documentation

## 2022-09-22 DIAGNOSIS — K4090 Unilateral inguinal hernia, without obstruction or gangrene, not specified as recurrent: Secondary | ICD-10-CM | POA: Diagnosis not present

## 2022-09-22 DIAGNOSIS — N50812 Left testicular pain: Secondary | ICD-10-CM | POA: Diagnosis present

## 2022-09-22 LAB — CBC WITH DIFFERENTIAL/PLATELET
Abs Immature Granulocytes: 0.04 10*3/uL (ref 0.00–0.07)
Basophils Absolute: 0.1 10*3/uL (ref 0.0–0.1)
Basophils Relative: 1 %
Eosinophils Absolute: 0.1 10*3/uL (ref 0.0–0.5)
Eosinophils Relative: 1 %
HCT: 39.8 % (ref 39.0–52.0)
Hemoglobin: 13.6 g/dL (ref 13.0–17.0)
Immature Granulocytes: 0 %
Lymphocytes Relative: 18 %
Lymphs Abs: 1.9 10*3/uL (ref 0.7–4.0)
MCH: 30.4 pg (ref 26.0–34.0)
MCHC: 34.2 g/dL (ref 30.0–36.0)
MCV: 89 fL (ref 80.0–100.0)
Monocytes Absolute: 1 10*3/uL (ref 0.1–1.0)
Monocytes Relative: 9 %
Neutro Abs: 7.7 10*3/uL (ref 1.7–7.7)
Neutrophils Relative %: 71 %
Platelets: 323 10*3/uL (ref 150–400)
RBC: 4.47 MIL/uL (ref 4.22–5.81)
RDW: 12.9 % (ref 11.5–15.5)
WBC: 10.8 10*3/uL — ABNORMAL HIGH (ref 4.0–10.5)
nRBC: 0 % (ref 0.0–0.2)

## 2022-09-22 LAB — URINALYSIS, ROUTINE W REFLEX MICROSCOPIC
Bilirubin Urine: NEGATIVE
Glucose, UA: NEGATIVE mg/dL
Hgb urine dipstick: NEGATIVE
Ketones, ur: 15 mg/dL — AB
Leukocytes,Ua: NEGATIVE
Nitrite: NEGATIVE
Protein, ur: NEGATIVE mg/dL
Specific Gravity, Urine: 1.02 (ref 1.005–1.030)
pH: 6.5 (ref 5.0–8.0)

## 2022-09-22 LAB — BASIC METABOLIC PANEL
Anion gap: 9 (ref 5–15)
BUN: 14 mg/dL (ref 6–20)
CO2: 25 mmol/L (ref 22–32)
Calcium: 9.2 mg/dL (ref 8.9–10.3)
Chloride: 102 mmol/L (ref 98–111)
Creatinine, Ser: 1.01 mg/dL (ref 0.61–1.24)
GFR, Estimated: >60 mL/min (ref >60)
Glucose, Bld: 101 mg/dL — ABNORMAL HIGH (ref 70–99)
Potassium: 3.5 mmol/L (ref 3.5–5.1)
Sodium: 136 mmol/L (ref 135–145)

## 2022-09-22 MED ORDER — CEFTRIAXONE SODIUM 500 MG IJ SOLR
500.0000 mg | Freq: Once | INTRAMUSCULAR | Status: AC
  Administered 2022-09-22: 500 mg via INTRAMUSCULAR
  Filled 2022-09-22: qty 500

## 2022-09-22 MED ORDER — DOXYCYCLINE HYCLATE 100 MG PO TABS
100.0000 mg | ORAL_TABLET | Freq: Once | ORAL | Status: AC
  Administered 2022-09-22: 100 mg via ORAL
  Filled 2022-09-22: qty 1

## 2022-09-22 MED ORDER — DOXYCYCLINE HYCLATE 100 MG PO CAPS: 100.0000 mg | ORAL_CAPSULE | Freq: Two times a day (BID) | ORAL | 0 refills | Status: AC

## 2022-09-22 MED ORDER — DOXYCYCLINE HYCLATE 100 MG PO CAPS: 100.0000 mg | ORAL_CAPSULE | Freq: Two times a day (BID) | ORAL | 0 refills | Status: DC

## 2022-09-22 MED ORDER — IBUPROFEN 800 MG PO TABS
800.0000 mg | ORAL_TABLET | Freq: Once | ORAL | Status: AC
  Administered 2022-09-22: 800 mg via ORAL
  Filled 2022-09-22: qty 1

## 2022-09-22 MED ORDER — LIDOCAINE HCL (PF) 1 % IJ SOLN
INTRAMUSCULAR | Status: AC
  Filled 2022-09-22: qty 5

## 2022-09-22 NOTE — ED Triage Notes (Signed)
Pt c/o left groin pain for a couple of months. Pt reports he was seen for same here about a month ago for same. Pt reports he took complete round of antibiotics but pain is still there.

## 2022-09-22 NOTE — ED Provider Notes (Signed)
[place] [facility] Provider Note   CSN: [id number] Arrival date & time: 09/22/22  [id number]     History  Chief Complaint  Patient presents with   Groin Pain    James Clements is a 42 y.o. male.  Patient with ongoing left testicular pain for the past several months.  States pain is fairly constant but waxes and wanes in severity.  Never goes away completely.  Was seen in February and treated for epididymitis with a course of levofloxacin which he said he completed.  Still having the same pain.  Has not been sexually active for many months.  Denies any discharge from his penis.  Some dysuria and "feels like pebbles" in my urethra.  Denies any hematuria, fever, vomiting.  Pain is worse with attempted defecation.  No black or bloody stools.  No abdominal pain, flank pain or back pain.  No chest pain or shortness of breath.  Taking ibuprofen without relief.  No previous abdominal surgeries.  No history of kidney stones.  Ultrasound in February showed evidence of epididymitis.  The history is provided by the patient.  Groin Pain Pertinent negatives include no chest pain, no abdominal pain, no headaches and no shortness of breath.       Home Medications Prior to Admission medications   Medication Sig Start Date End Date Taking? Authorizing Provider  hydrOXYzine (VISTARIL) 25 MG capsule Take 1 capsule (25 mg total) by mouth 3 (three) times daily as needed for anxiety. 03/10/21   White, Mellody Life, NP      Allergies    Darvocet [propoxyphene n-acetaminophen]    Review of Systems   Review of Systems  Constitutional:  Negative for activity change, appetite change and fever.  HENT:  Negative for congestion.   Respiratory:  Negative for cough, chest tightness and shortness of breath.   Cardiovascular:  Negative for chest pain.  Gastrointestinal:  Negative for abdominal pain, nausea and vomiting.  Genitourinary:  Positive for dysuria, penile pain, testicular  pain and urgency. Negative for hematuria.  Musculoskeletal:  Negative for arthralgias and myalgias.  Skin:  Negative for rash.  Neurological:  Negative for dizziness, weakness and headaches.   all other systems are negative except as noted in the HPI and PMH.    Physical Exam Updated Vital Signs BP (!) 142/90 (BP Location: Right Arm)   Pulse 96   Temp 98.5 F (36.9 C) (Oral)   Resp 18   Ht 6\' 3"  (1.905 m)   Wt 108.9 kg   SpO2 100%   BMI 30.00 kg/m  Physical Exam Vitals and nursing note reviewed.  Constitutional:      General: He is not in acute distress.    Appearance: He is well-developed.     Comments: anxious  HENT:     Head: Normocephalic and atraumatic.     Mouth/Throat:     Pharynx: No oropharyngeal exudate.  Eyes:     Conjunctiva/sclera: Conjunctivae normal.     Pupils: Pupils are equal, round, and reactive to light.  Neck:     Comments: No meningismus. Cardiovascular:     Rate and Rhythm: Normal rate and regular rhythm.     Heart sounds: Normal heart sounds. No murmur heard. Pulmonary:     Effort: Pulmonary effort is normal. No respiratory distress.     Breath sounds: Normal breath sounds.  Abdominal:     Palpations: Abdomen is soft.     Tenderness: There is no abdominal tenderness. There  is no guarding or rebound.  Genitourinary:    Comments: Testicles normal to inspection.  There is no obvious mass or erythema.  Nontender to palpation. Musculoskeletal:        General: No tenderness. Normal range of motion.     Cervical back: Normal range of motion and neck supple.  Skin:    General: Skin is warm.  Neurological:     Mental Status: He is alert and oriented to person, place, and time.     Cranial Nerves: No cranial nerve deficit.     Motor: No abnormal muscle tone.     Coordination: Coordination normal.     Comments: No ataxia on finger to nose bilaterally. No pronator drift. 5/5 strength throughout. CN 2-12 intact.Equal grip strength. Sensation intact.    Psychiatric:        Behavior: Behavior normal.     ED Results / Procedures / Treatments   Labs (all labs ordered are listed, but only abnormal results are displayed) Labs Reviewed  URINALYSIS, ROUTINE W REFLEX MICROSCOPIC - Abnormal; Notable for the following components:      Result Value   Ketones, ur 15 (*)    All other components within normal limits  CBC WITH DIFFERENTIAL/PLATELET - Abnormal; Notable for the following components:   WBC 10.8 (*)    All other components within normal limits  BASIC METABOLIC PANEL - Abnormal; Notable for the following components:   Glucose, Bld 101 (*)    All other components within normal limits    EKG None  Radiology US SCROTUM W/DOPPLER  Result Date: 09/22/2022 CLINICAL DATA:  Testicular pain EXAM: SCROTAL ULTRASOUND DOPPLER ULTRASOUND OF THE TESTICLES TECHNIQUE: Complete ultrasound examination of the testicles, epididymis, and other scrotal structures was performed. Color and spectral Doppler ultrasound were also utilized to evaluate blood flow to the testicles. COMPARISON:  CT of earlier today and scrotal ultrasound of 08/19/2022 FINDINGS: Right testicle Measurements: 5.9 x 3.4 x 3.2 cm. No mass or microlithiasis visualized. Left testicle Measurements: 5.7 x 3.1 x 3.5 cm. No mass or microlithiasis visualized. Right epididymis:  Normal in size and appearance. Left epididymis:  3 mm epididymal cyst versus spermatocele. Hydrocele:  None visualized. Varicocele:  None visualized. Pulsed Doppler interrogation of both testes demonstrates normal low resistance arterial and venous waveforms bilaterally. IMPRESSION: No scrotal ultrasound explanation for pain. 3 mm epididymal cyst versus spermatocele. Electronically Signed   By: Abigail Miyamoto M.D.   On: 09/22/2022 10:06   CT Renal Stone Study  Result Date: 09/22/2022 CLINICAL DATA:  Left groin pain. EXAM: CT ABDOMEN AND PELVIS WITHOUT CONTRAST TECHNIQUE: Multidetector CT imaging of the abdomen and pelvis  was performed following the standard protocol without IV contrast. RADIATION DOSE REDUCTION: This exam was performed according to the departmental dose-optimization program which includes automated exposure control, adjustment of the mA and/or kV according to patient size and/or use of iterative reconstruction technique. COMPARISON:  08/19/2022 scrotal ultrasound. FINDINGS: Lower chest: Clear lung bases. Normal heart size without pericardial or pleural effusion. Hepatobiliary: Normal liver. Normal gallbladder, without biliary ductal dilatation. Pancreas: Normal, without mass or ductal dilatation. Spleen: Normal in size, without focal abnormality. Adrenals/Urinary Tract: Normal adrenal glands. Punctate upper pole right renal collecting system calculus suspected on coronal image 38. No hydronephrosis. No hydroureter or ureteric calculi. No bladder calculi. Stomach/Bowel: Normal stomach, without wall thickening. Scattered colonic diverticula. Normal terminal ileum and appendix. Normal small bowel. Vascular/Lymphatic: Aortic atherosclerosis. No abdominopelvic adenopathy. Reproductive: Normal prostate. A tiny left inguinal hernia. Significant  enlargement of and edema surrounding the left inguinal contents. No drainable fluid collection. Other: No significant free fluid.  No free intraperitoneal air. Musculoskeletal: No acute osseous abnormality. IMPRESSION: 1. Enlargement of and edema surrounding the left inguinal contents in the setting of a small inguinal hernia. Favored to represent inflammation of the spermatic cord (funiculitis). An incarcerated inguinal hernia could look similar. No drainable abscess. 2. Probable right nephrolithiasis 3.  Aortic Atherosclerosis (ICD10-I70.0). Electronically Signed   By: Abigail Miyamoto M.D.   On: 09/22/2022 08:24    Procedures Procedures    Medications Ordered in ED Medications  ibuprofen (ADVIL) tablet 800 mg (has no administration in time range)    ED Course/ Medical  Decision Making/ A&P                             Medical Decision Making Amount and/or Complexity of Data Reviewed Labs: ordered. Decision-making details documented in ED Course. Radiology: ordered and independent interpretation performed. Decision-making details documented in ED Course. ECG/medicine tests: ordered and independent interpretation performed. Decision-making details documented in ED Course.  Risk Prescription drug management.  Ongoing left testicular pain for several months.  Vital stable, no distress.  Recently treated for epididymitis.  CT findings discussed with Dr. Rosendo Gros of general surgery.  Does have small fat-containing inguinal hernia bilaterally.  No evidence of bowel contained in hernia.  There is extensive inflammation as well as spermatic cord which is favored to be the source of this inflammation in the hernia area.  Dr. Rosendo Gros does not feel this is an incarcerated hernia and is only fat-containing.  Urinalysis negative.  No hematuria.  Ultrasound obtained shows no evidence of testicular torsion.  Does show a 3 mm epididymal cyst.  Will treat again for suspected epididymitis with IM Rocephin and p.o. doxycycline.  Patient informed of need for follow-up with both general surgery and urology.  Discussed with Dr. Alyson Ingles of urology.  He states treatment for funiculitis is the same as for epididymitis.  Recommends IM Rocephin and p.o. doxycycline and outpatient follow-up.  Follow-up with urology as well as general surgery.  Safe sexual practices discussed.  Return precautions given.       Final Clinical Impression(s) / ED Diagnoses Final diagnoses:  Funiculitis  Unilateral inguinal hernia without obstruction or gangrene, recurrence not specified    Rx / DC Orders ED Discharge Orders     None         Arnette Driggs, Annie Main, MD 09/22/22 1143

## 2022-09-22 NOTE — Discharge Instructions (Signed)
Take the antibiotics as prescribed and follow-up with the urologist regarding the inflammation of your spermatic cord and testicle.  You also have a small inguinal hernia that contains fat.  Follow-up with a general surgeon regarding this for further evaluation.  Return to the ED with new or worsening symptoms.

## 2022-10-05 ENCOUNTER — Emergency Department (HOSPITAL_BASED_OUTPATIENT_CLINIC_OR_DEPARTMENT_OTHER)
Admission: EM | Admit: 2022-10-05 | Discharge: 2022-10-05 | Disposition: A | Discharge status: Home / Self Care | Payer: 59 | Source: Home / Self Care | Attending: Emergency Medicine | Admitting: Emergency Medicine

## 2022-10-05 ENCOUNTER — Other Ambulatory Visit: Payer: Self-pay

## 2022-10-05 ENCOUNTER — Emergency Department (HOSPITAL_COMMUNITY)
Admission: EM | Admit: 2022-10-05 | Discharge: 2022-10-05 | Discharge status: Against Medical Advice | Payer: 59 | Attending: Emergency Medicine | Admitting: Emergency Medicine

## 2022-10-05 ENCOUNTER — Emergency Department (HOSPITAL_COMMUNITY): Payer: 59

## 2022-10-05 ENCOUNTER — Encounter (HOSPITAL_BASED_OUTPATIENT_CLINIC_OR_DEPARTMENT_OTHER): Payer: Self-pay | Admitting: Emergency Medicine

## 2022-10-05 DIAGNOSIS — N50819 Testicular pain, unspecified: Secondary | ICD-10-CM | POA: Insufficient documentation

## 2022-10-05 DIAGNOSIS — N50812 Left testicular pain: Secondary | ICD-10-CM | POA: Insufficient documentation

## 2022-10-05 DIAGNOSIS — Z5321 Procedure and treatment not carried out due to patient leaving prior to being seen by health care provider: Secondary | ICD-10-CM | POA: Insufficient documentation

## 2022-10-05 LAB — URINALYSIS, ROUTINE W REFLEX MICROSCOPIC
Bilirubin Urine: NEGATIVE
Glucose, UA: NEGATIVE mg/dL
Hgb urine dipstick: NEGATIVE
Ketones, ur: NEGATIVE mg/dL
Leukocytes,Ua: NEGATIVE
Nitrite: NEGATIVE
Protein, ur: NEGATIVE mg/dL
Specific Gravity, Urine: 1.024 (ref 1.005–1.030)
pH: 5 (ref 5.0–8.0)

## 2022-10-05 MED ORDER — KETOROLAC TROMETHAMINE 30 MG/ML IJ SOLN
15.0000 mg | Freq: Once | INTRAMUSCULAR | Status: AC
  Administered 2022-10-05: 15 mg via INTRAMUSCULAR
  Filled 2022-10-05: qty 1

## 2022-10-05 NOTE — ED Triage Notes (Signed)
PT was treated for testicular infection a few weeks ago. He states he has finished antibiotics and started on another one. He says pain is excruciating. He has had 2 [place] of his testicles.

## 2022-10-05 NOTE — ED Provider Notes (Signed)
[place] [facility] Provider Note   CSN: [id number] Arrival date & time: 10/05/22  1213     History  Chief Complaint  Patient presents with   Groin Pain    James Clements is a 42 y.o. male.  Patient presents the emergency department complaining of left-sided testicular pain.  Patient states that this pain has been ongoing since February.  He has had multiple ultrasounds and CT scan of the scrotum and testicles.  His most recent ultrasound with Doppler for torsion rule out was last night at [facility].  The patient left prior to getting results due to wait times.  He has had 2+ rounds of antibiotics for possible epididymitis, funiculitis.  It has been recommended that he follow-up with urology but the patient states he lost the paperwork and has not been able to follow-up.  He endorses taking 1-2 ibuprofen daily for pain prior to arrival.  He denies nausea, vomiting, other abdominal pain, urinary symptoms, penile discharge.  No other past medical history on file  HPI     Home Medications Prior to Admission medications   Medication Sig Start Date End Date Taking? Authorizing Provider  doxycycline (VIBRAMYCIN) 100 MG capsule Take 1 capsule (100 mg total) by mouth 2 (two) times daily. 09/22/22   Rancour, Jeannett Senior, MD  hydrOXYzine (VISTARIL) 25 MG capsule Take 1 capsule (25 mg total) by mouth 3 (three) times daily as needed for anxiety. 03/10/21   White, Chrystine Oiler, NP      Allergies    Darvocet [propoxyphene n-acetaminophen]    Review of Systems   Review of Systems  Physical Exam Updated Vital Signs BP 117/86 (BP Location: Left Arm)   Pulse 89   Temp 97.9 F (36.6 C) (Oral)   Resp 16   Ht 6\' 3"  (1.905 m)   Wt 108.9 kg   SpO2 100%   BMI 30.00 kg/m  Physical Exam Vitals and nursing note reviewed.  Constitutional:      General: He is not in acute distress.    Appearance: He is well-developed.  HENT:     Head: Normocephalic and atraumatic.   Eyes:     Conjunctiva/sclera: Conjunctivae normal.  Cardiovascular:     Rate and Rhythm: Normal rate and regular rhythm.     Heart sounds: No murmur heard. Pulmonary:     Effort: Pulmonary effort is normal. No respiratory distress.     Breath sounds: Normal breath sounds.  Abdominal:     Palpations: Abdomen is soft.     Tenderness: There is no abdominal tenderness.  Genitourinary:    Testes:        Right: Mass, tenderness, swelling, testicular hydrocele or varicocele not present. Right testis is descended. Cremasteric reflex is present.         Left: Tenderness present. Mass, swelling, testicular hydrocele or varicocele not present. Left testis is descended. Cremasteric reflex is present.      Epididymis:     Left: Not enlarged. Tenderness present. No mass.  Musculoskeletal:        General: No swelling.     Cervical back: Neck supple.  Skin:    General: Skin is warm and dry.     Capillary Refill: Capillary refill takes less than 2 seconds.  Neurological:     Mental Status: He is alert.  Psychiatric:        Mood and Affect: Mood normal.     ED Results / Procedures / Treatments   Labs (  all labs ordered are listed, but only abnormal results are displayed) Labs Reviewed - No data to display  EKG None  Radiology US SCROTUM W/DOPPLER  Result Date: 10/05/2022 CLINICAL DATA:  Ongoing left-sided scrotum pain.  On antibiotics EXAM: SCROTAL ULTRASOUND DOPPLER ULTRASOUND OF THE TESTICLES TECHNIQUE: Complete ultrasound examination of the testicles, epididymis, and other scrotal structures was performed. Color and spectral Doppler ultrasound were also utilized to evaluate blood flow to the testicles. COMPARISON:  09/22/2022 FINDINGS: Right testicle Measurements: 60 x 28 x 40 mm.  No mass or abnormal vascularity Left testicle Measurements:  58 x 30 x 36 mm.  No mass or abnormal vascularity. Right epididymis:  No thickening or abnormal vascularity Left epididymis: No thickening or abnormal  vascularity. Incidental 2 mm cyst. Hydrocele:  None visualized. Varicocele:  None visualized. Pulsed Doppler interrogation of both testes demonstrates normal low resistance arterial waveforms. IMPRESSION: Stable scrotal ultrasound without specific cause for pain. Electronically Signed   By: James Clements M.D.   On: 10/05/2022 04:44    Procedures Procedures    Medications Ordered in ED Medications  ketorolac (TORADOL) 30 MG/ML injection 15 mg (15 mg Intramuscular Given 10/05/22 1434)    ED Course/ Medical Decision Making/ A&P                             Medical Decision Making Risk Prescription drug management.   Patient presents to the emergency room with a chief complaint of left-sided testicular pain.  Differential diagnosis includes but is not limited to testicular torsion, epididymitis, orchitis, others  I reviewed the patient's past medical history including notes from 2 previous emergency department visits.  He was seen on February 26 and then again on March 31, both times diagnosed with initially epididymitis and later funiculitis.  He was given antibiotics both times.  On March 31 it was recommended that he follow-up with urology.  I reviewed imaging ordered last night at [facility] which included an ultrasound of the scrotum with Doppler.  No torsion, epididymitis, or other sonographic abnormality noted  I ordered the patient Toradol for inflammation.  Upon reassessment he is feeling somewhat better.  At this time plan to discharge patient home with recommendations for outpatient urologic follow-up.  No torsion on imaging.  No epididymitis on imaging.  The patient has had 2 courses of antibiotics with no improvement of symptoms.  Plan to have patient take over-the-counter ibuprofen starting tomorrow after the Toradol has worn off and follow-up with urology.  Return precautions provided.        Final Clinical Impression(s) / ED Diagnoses Final diagnoses:  Pain  in left testicle    Rx / DC Orders ED Discharge Orders     None         James Clements 10/05/22 1513    Alvira Monday, MD 10/05/22 2152

## 2022-10-05 NOTE — ED Notes (Signed)
Pt called for vitals multiple times, no response. Not seen in lobby at this time

## 2022-10-05 NOTE — Discharge Instructions (Signed)
You were evaluated today for left-sided testicular pain.  I reviewed the ultrasound from last night which showed no signs of epididymitis or testicular torsion.  Please follow-up with urology.  I have attached the name and number.  You may take up to 600 mg of ibuprofen every 6 hours.  You may also take Tylenol.  Return to the emergency department if you develop any life-threatening symptoms.

## 2022-10-05 NOTE — ED Triage Notes (Addendum)
Patient c/o pain to groin area ongoing for few weeks. Patient was at [facility] but left due to wait time.

## 2022-10-05 NOTE — ED Notes (Signed)
Pt called for vitals, no response at this time

## 2024-05-24 ENCOUNTER — Telehealth: Payer: Self-pay | Admitting: *Deleted

## 2024-05-24 NOTE — Telephone Encounter (Addendum)
 Incorrect EMR.  Names match, different Date of birth.
